# Patient Record
Sex: Female | Born: 1990 | Race: White | Hispanic: No | Smoking: Former smoker
Health system: Southern US, Community
[De-identification: ages and names within clinical notes are randomized; demographics above are authoritative.]

## PROBLEM LIST (undated history)

## (undated) ENCOUNTER — Inpatient Hospital Stay (HOSPITAL_COMMUNITY): Payer: Self-pay

## (undated) DIAGNOSIS — K219 Gastro-esophageal reflux disease without esophagitis: Secondary | ICD-10-CM

## (undated) DIAGNOSIS — J45909 Unspecified asthma, uncomplicated: Secondary | ICD-10-CM

## (undated) DIAGNOSIS — Z8719 Personal history of other diseases of the digestive system: Secondary | ICD-10-CM

## (undated) DIAGNOSIS — K5120 Ulcerative (chronic) proctitis without complications: Secondary | ICD-10-CM

## (undated) DIAGNOSIS — F329 Major depressive disorder, single episode, unspecified: Secondary | ICD-10-CM

## (undated) DIAGNOSIS — F909 Attention-deficit hyperactivity disorder, unspecified type: Secondary | ICD-10-CM

## (undated) DIAGNOSIS — F419 Anxiety disorder, unspecified: Secondary | ICD-10-CM

## (undated) DIAGNOSIS — F32A Depression, unspecified: Secondary | ICD-10-CM

## (undated) HISTORY — PX: TONSILLECTOMY: SUR1361

---
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
## 2015-01-04 DIAGNOSIS — F411 Generalized anxiety disorder: Secondary | ICD-10-CM | POA: Insufficient documentation

## 2015-01-04 DIAGNOSIS — E669 Obesity, unspecified: Secondary | ICD-10-CM | POA: Insufficient documentation

## 2015-10-13 ENCOUNTER — Encounter (HOSPITAL_COMMUNITY): Payer: Self-pay

## 2015-10-13 ENCOUNTER — Emergency Department (HOSPITAL_COMMUNITY)
Admission: EM | Admit: 2015-10-13 | Discharge: 2015-10-13 | Disposition: A | Discharge status: Home / Self Care | Payer: BLUE CROSS/BLUE SHIELD | Attending: Emergency Medicine | Admitting: Emergency Medicine

## 2015-10-13 DIAGNOSIS — R197 Diarrhea, unspecified: Secondary | ICD-10-CM | POA: Diagnosis not present

## 2015-10-13 DIAGNOSIS — K921 Melena: Secondary | ICD-10-CM | POA: Insufficient documentation

## 2015-10-13 DIAGNOSIS — F909 Attention-deficit hyperactivity disorder, unspecified type: Secondary | ICD-10-CM | POA: Insufficient documentation

## 2015-10-13 DIAGNOSIS — J45909 Unspecified asthma, uncomplicated: Secondary | ICD-10-CM | POA: Insufficient documentation

## 2015-10-13 DIAGNOSIS — Z79899 Other long term (current) drug therapy: Secondary | ICD-10-CM | POA: Diagnosis not present

## 2015-10-13 DIAGNOSIS — R1084 Generalized abdominal pain: Secondary | ICD-10-CM | POA: Diagnosis not present

## 2015-10-13 DIAGNOSIS — F419 Anxiety disorder, unspecified: Secondary | ICD-10-CM | POA: Insufficient documentation

## 2015-10-13 DIAGNOSIS — F17210 Nicotine dependence, cigarettes, uncomplicated: Secondary | ICD-10-CM | POA: Insufficient documentation

## 2015-10-13 DIAGNOSIS — Z7951 Long term (current) use of inhaled steroids: Secondary | ICD-10-CM | POA: Diagnosis not present

## 2015-10-13 DIAGNOSIS — K219 Gastro-esophageal reflux disease without esophagitis: Secondary | ICD-10-CM | POA: Diagnosis not present

## 2015-10-13 DIAGNOSIS — Z792 Long term (current) use of antibiotics: Secondary | ICD-10-CM | POA: Diagnosis not present

## 2015-10-13 DIAGNOSIS — Z3202 Encounter for pregnancy test, result negative: Secondary | ICD-10-CM | POA: Insufficient documentation

## 2015-10-13 HISTORY — DX: Unspecified asthma, uncomplicated: J45.909

## 2015-10-13 HISTORY — DX: Gastro-esophageal reflux disease without esophagitis: K21.9

## 2015-10-13 HISTORY — DX: Attention-deficit hyperactivity disorder, unspecified type: F90.9

## 2015-10-13 HISTORY — DX: Anxiety disorder, unspecified: F41.9

## 2015-10-13 LAB — GASTROINTESTINAL PANEL BY PCR, STOOL (REPLACES STOOL CULTURE)
Adenovirus F40/41: NOT DETECTED
Astrovirus: NOT DETECTED
CAMPYLOBACTER SPECIES: NOT DETECTED
Cryptosporidium: NOT DETECTED
Cyclospora cayetanensis: NOT DETECTED
E. COLI O157: NOT DETECTED
ENTEROAGGREGATIVE E COLI (EAEC): NOT DETECTED
ENTEROTOXIGENIC E COLI (ETEC): NOT DETECTED
Entamoeba histolytica: NOT DETECTED
Enteropathogenic E coli (EPEC): NOT DETECTED
GIARDIA LAMBLIA: NOT DETECTED
NOROVIRUS GI/GII: NOT DETECTED
PLESIMONAS SHIGELLOIDES: NOT DETECTED
Rotavirus A: NOT DETECTED
SALMONELLA SPECIES: NOT DETECTED
SHIGELLA/ENTEROINVASIVE E COLI (EIEC): NOT DETECTED
Sapovirus (I, II, IV, and V): NOT DETECTED
Shiga like toxin producing E coli (STEC): NOT DETECTED
Vibrio cholerae: NOT DETECTED
Vibrio species: NOT DETECTED
Yersinia enterocolitica: NOT DETECTED

## 2015-10-13 LAB — POC OCCULT BLOOD, ED: FECAL OCCULT BLD: POSITIVE — AB

## 2015-10-13 LAB — CBC WITH DIFFERENTIAL/PLATELET
BASOS PCT: 0 %
Basophils Absolute: 0 10*3/uL (ref 0.0–0.1)
EOS ABS: 0.3 10*3/uL (ref 0.0–0.7)
Eosinophils Relative: 3 %
HCT: 39.4 % (ref 36.0–46.0)
HEMOGLOBIN: 13.2 g/dL (ref 12.0–15.0)
Lymphocytes Relative: 25 %
Lymphs Abs: 2.6 10*3/uL (ref 0.7–4.0)
MCH: 26.6 pg (ref 26.0–34.0)
MCHC: 33.5 g/dL (ref 30.0–36.0)
MCV: 79.3 fL (ref 78.0–100.0)
Monocytes Absolute: 1.2 10*3/uL — ABNORMAL HIGH (ref 0.1–1.0)
Monocytes Relative: 12 %
NEUTROS PCT: 60 %
Neutro Abs: 6.4 10*3/uL (ref 1.7–7.7)
Platelets: 277 10*3/uL (ref 150–400)
RBC: 4.97 MIL/uL (ref 3.87–5.11)
RDW: 12.7 % (ref 11.5–15.5)
WBC: 10.6 10*3/uL — AB (ref 4.0–10.5)

## 2015-10-13 LAB — COMPREHENSIVE METABOLIC PANEL
ALBUMIN: 4.1 g/dL (ref 3.5–5.0)
ALT: 11 U/L — ABNORMAL LOW (ref 14–54)
AST: 15 U/L (ref 15–41)
Alkaline Phosphatase: 59 U/L (ref 38–126)
Anion gap: 12 (ref 5–15)
BILIRUBIN TOTAL: 0.2 mg/dL — AB (ref 0.3–1.2)
BUN: 12 mg/dL (ref 6–20)
CHLORIDE: 104 mmol/L (ref 101–111)
CO2: 22 mmol/L (ref 22–32)
Calcium: 9.4 mg/dL (ref 8.9–10.3)
Creatinine, Ser: 0.71 mg/dL (ref 0.44–1.00)
GFR calc Af Amer: >60 mL/min (ref >60)
GFR calc non Af Amer: >60 mL/min (ref >60)
GLUCOSE: 97 mg/dL (ref 65–99)
POTASSIUM: 4.2 mmol/L (ref 3.5–5.1)
Sodium: 138 mmol/L (ref 135–145)
Total Protein: 7.5 g/dL (ref 6.5–8.1)

## 2015-10-13 LAB — I-STAT BETA HCG BLOOD, ED (MC, WL, AP ONLY)

## 2015-10-13 LAB — TYPE AND SCREEN
ABO/RH(D): A NEG
Antibody Screen: NEGATIVE

## 2015-10-13 LAB — LIPASE, BLOOD: LIPASE: 22 U/L (ref 11–51)

## 2015-10-13 LAB — ABO/RH: ABO/RH(D): A NEG

## 2015-10-13 MED ORDER — SODIUM CHLORIDE 0.9 % IV BOLUS (SEPSIS)
1000.0000 mL | Freq: Once | INTRAVENOUS | Status: AC
  Administered 2015-10-13: 1000 mL via INTRAVENOUS

## 2015-10-13 MED ORDER — DICYCLOMINE HCL 20 MG PO TABS: 20.0000 mg | ORAL_TABLET | Freq: Two times a day (BID) | ORAL | Status: DC

## 2015-10-13 MED ORDER — LOPERAMIDE HCL 2 MG PO CAPS: 2.0000 mg | ORAL_CAPSULE | Freq: Four times a day (QID) | ORAL | Status: DC | PRN

## 2015-10-13 MED ORDER — DICYCLOMINE HCL 10 MG PO CAPS
10.0000 mg | ORAL_CAPSULE | Freq: Once | ORAL | Status: AC
  Administered 2015-10-13: 10 mg via ORAL
  Filled 2015-10-13: qty 1

## 2015-10-13 NOTE — ED Provider Notes (Signed)
CSN: [id number]     Arrival date & time 10/13/15  4098 History   First MD Initiated Contact with Patient 10/13/15 1000     Chief Complaint  Patient presents with  . Abdominal Pain  . Rectal Bleeding     (Consider location/radiation/quality/duration/timing/severity/associated sxs/prior Treatment) HPI   Patient is a 25 year old female with past medical history of GERD, asthma and anxiety who presents to the ED with complaint of diarrhea. Patient reports she has been having diarrhea since 09/21/15. She reports initially having approximately 10 episodes of "rust colored" diarrhea daily but notes over the past week she has been having approximately 20 episodes. She also notes that her stool is a dark maroon/rust color which she notes has also been worsening over the past week. Patient also reports having constant dull/cramping pain to her right abdomen with intermittent episodes of sharp shooting pain, denies any aggravating or alleviating factors. Endorses associated chills and intermittent nausea. Denies fever, nasal congestion, sore throat, cough, shortness of breath, chest pain, vomiting, urinary symptoms, vaginal bleeding, vaginal discharge, rash. She notes she initially was taking Imodium and Pepto-Bismol at home without relief. She notes she was seen at [facility] health care (Dr. Jilda Roche) on 10/10/15, blood work and stool samples were obtained. Patient states she was started on Cipro and notes she has been taking it twice daily as prescribed and was advised to stop taking Imodium and Pepto-Bismol. Patient denies any recent travel, camping or drinking from fresh water. She reports living in an apartment complex with city water, denies her roommates having similar sxs. Denies any recent sick contacts or anyone at home having similar symptoms. LMP 2/19. Denies hx of abdominal surgeries.    Past Medical History  Diagnosis Date  . GERD (gastroesophageal reflux disease)   . ADHD (attention deficit  hyperactivity disorder)   . Asthma   . Anxiety    Past Surgical History  Procedure Laterality Date  . Tonsillectomy     History reviewed. No pertinent family history. Social History  Substance Use Topics  . Smoking status: Current Some Day Smoker    Types: Cigarettes  . Smokeless tobacco: Never Used  . Alcohol Use: Yes     Comment: ocasionally   OB History    No data available     Review of Systems  Gastrointestinal: Positive for abdominal pain, diarrhea and blood in stool.  All other systems reviewed and are negative.     Allergies  Azithromycin; Bupropion; and Cefprozil  Home Medications   Prior to Admission medications   Medication Sig Start Date End Date Taking? Authorizing Provider  amphetamine-dextroamphetamine (ADDERALL XR) 30 MG 24 hr capsule Take 60 mg by mouth daily as needed (concentration working).  10/03/15  Yes Historical Provider, MD  ciprofloxacin (CIPRO) 500 MG tablet Take 500 mg by mouth 2 (two) times daily.   Yes Historical Provider, MD  clonazePAM (KLONOPIN) 0.5 MG tablet Take 0.5 mg by mouth daily as needed. anxiety 07/10/15  Yes Historical Provider, MD  fluticasone (FLONASE) 50 MCG/ACT nasal spray Place 1 spray into both nostrils 2 (two) times daily as needed. allergies 09/13/15  Yes Historical Provider, MD  ipratropium (ATROVENT) 0.03 % nasal spray Place 2 sprays into both nostrils daily as needed. allergies 06/15/15 06/14/16 Yes Historical Provider, MD  omeprazole (PRILOSEC) 20 MG capsule Take 20 mg by mouth daily. 09/13/15  Yes Historical Provider, MD  triamcinolone ointment (KENALOG) 0.1 % Apply 1 application topically daily as needed. Irritation/rash 06/15/15 06/14/16 Yes Historical Provider,  MD  VENTOLIN HFA 108 (90 Base) MCG/ACT inhaler Inhale 2 puffs into the lungs every 6 (six) hours as needed. Shortness of breath/ wheezing 09/01/15  Yes Historical Provider, MD  dicyclomine (BENTYL) 20 MG tablet Take 1 tablet (20 mg total) by mouth 2 (two) times  daily. 10/13/15   Barrett Henle, PA-C  fexofenadine (ALLEGRA) 180 MG tablet Take 180 mg by mouth daily as needed. Reported on 10/13/2015 09/14/15   Historical Provider, MD  loperamide (IMODIUM) 2 MG capsule Take 1 capsule (2 mg total) by mouth 4 (four) times daily as needed for diarrhea or loose stools. 10/13/15   Satira Sark Nadeau, PA-C   BP 109/72 mmHg  Pulse 87  Temp(Src) 98.2 F (36.8 C) (Oral)  Resp 16  Ht 5\' 3"  (1.6 m)  Wt 72.576 kg  BMI 28.35 kg/m2  SpO2 98%  LMP 10/13/2015 Physical Exam  Constitutional: She is oriented to person, place, and time. She appears well-developed and well-nourished. No distress.  HENT:  Head: Normocephalic and atraumatic.  Mouth/Throat: Oropharynx is clear and moist. No oropharyngeal exudate.  Eyes: Conjunctivae and EOM are normal. Right eye exhibits no discharge. Left eye exhibits no discharge. No scleral icterus.  Neck: Normal range of motion. Neck supple.  Cardiovascular: Normal rate, regular rhythm, normal heart sounds and intact distal pulses.   Pulmonary/Chest: Effort normal and breath sounds normal. No respiratory distress. She has no wheezes. She has no rales. She exhibits no tenderness.  Abdominal: Soft. Bowel sounds are normal. She exhibits no distension and no mass. There is tenderness (difffuse abdominal tenderness, worse on right). There is no rebound and no guarding.  Genitourinary: Rectal exam shows no external hemorrhoid, no internal hemorrhoid, no fissure, no mass, no tenderness and anal tone normal. Guaiac positive stool.  Musculoskeletal: Normal range of motion. She exhibits no edema.  Lymphadenopathy:    She has no cervical adenopathy.  Neurological: She is alert and oriented to person, place, and time.  Skin: Skin is warm and dry. She is not diaphoretic.  Nursing note and vitals reviewed.   ED Course  Procedures (including critical care time) Labs Review Labs Reviewed  COMPREHENSIVE METABOLIC PANEL - Abnormal;  Notable for the following:    ALT 11 (*)    Total Bilirubin 0.2 (*)    All other components within normal limits  CBC WITH DIFFERENTIAL/PLATELET - Abnormal; Notable for the following:    WBC 10.6 (*)    Monocytes Absolute 1.2 (*)    All other components within normal limits  POC OCCULT BLOOD, ED - Abnormal; Notable for the following:    Fecal Occult Bld POSITIVE (*)    All other components within normal limits  GASTROINTESTINAL PANEL BY PCR, STOOL (REPLACES STOOL CULTURE)  LIPASE, BLOOD  I-STAT BETA HCG BLOOD, ED ([facility], [facility], [facility] ONLY)  TYPE AND SCREEN  ABO/RH    Imaging Review No results found. I have personally reviewed and evaluated these images and lab results as part of my medical decision-making.   EKG Interpretation None      MDM   Final diagnoses:  Diarrhea, unspecified type    Pt presents with diarrhea and associated abdominal pain and maroon colored stool. She has been taking Cipro for 3 days without relief. VSS. Exam revealed diffuse mild abdominal tenderness, no peritoneal signs, rectal exam unremarkable, no gross blood noted. Pt given IVF and bentyl in the ED. Hemoccult positive. Remaining labs unremarkable. GI panel PCR stool pending. On reevaluation, pt reports she is feeling  a little better. Discussed case with Dr. Effie Shy. It does not appear that pt's diarrhea is due to infectious etiology. Negative risk factors for parasitic infection. Sxs may be due to IBS/IBD. Plan to d/c pt home with symptomatic tx, advise pt to stop taking cipro and to follow up with GI. Discussed results and plan for d/c with pt.     Satira Sark [place], [place] 10/14/15 1000  Mancel Bale, MD 10/14/15 [phone]

## 2015-10-13 NOTE — ED Notes (Signed)
Patient c/o generalized abdominal pain and a combination of bright red and rust colored blood in her stool. Patient went to her PCP 2 days ago where she gave stool specimens. Patient states she is continuing to have symptoms.

## 2015-10-13 NOTE — Discharge Instructions (Signed)
Take your medication as prescribed. Continue drinking fluids to remain hydrated at home. Call the gastroenterology office listed above to schedule a follow-up appointment. Please return to the Emergency Department if symptoms worsen or new onset of fever, abdominal pain, vomiting, bright red bloody stool, vaginal bleeding, lightheadedness, dizziness, syncope.

## 2015-10-20 DIAGNOSIS — J4540 Moderate persistent asthma, uncomplicated: Secondary | ICD-10-CM | POA: Insufficient documentation

## 2016-05-21 LAB — OB RESULTS CONSOLE HEPATITIS B SURFACE ANTIGEN: HEP B S AG: NEGATIVE

## 2016-05-21 LAB — OB RESULTS CONSOLE ABO/RH: RH TYPE: POSITIVE

## 2016-05-21 LAB — OB RESULTS CONSOLE GC/CHLAMYDIA
Chlamydia: NEGATIVE
GC PROBE AMP, GENITAL: NEGATIVE

## 2016-05-21 LAB — OB RESULTS CONSOLE RUBELLA ANTIBODY, IGM: RUBELLA: IMMUNE

## 2016-05-21 LAB — OB RESULTS CONSOLE ANTIBODY SCREEN: ANTIBODY SCREEN: NEGATIVE

## 2016-05-21 LAB — OB RESULTS CONSOLE HIV ANTIBODY (ROUTINE TESTING): HIV: NONREACTIVE

## 2016-05-21 LAB — OB RESULTS CONSOLE RPR: RPR: NONREACTIVE

## 2016-09-14 ENCOUNTER — Inpatient Hospital Stay (HOSPITAL_COMMUNITY)
Admission: AD | Admit: 2016-09-14 | Discharge: 2016-09-15 | Disposition: A | Discharge status: Home / Self Care | Payer: BLUE CROSS/BLUE SHIELD | Source: Ambulatory Visit | Attending: Obstetrics and Gynecology | Admitting: Obstetrics and Gynecology

## 2016-09-14 ENCOUNTER — Encounter (HOSPITAL_COMMUNITY): Payer: Self-pay | Admitting: *Deleted

## 2016-09-14 DIAGNOSIS — O99342 Other mental disorders complicating pregnancy, second trimester: Secondary | ICD-10-CM | POA: Diagnosis not present

## 2016-09-14 DIAGNOSIS — O99612 Diseases of the digestive system complicating pregnancy, second trimester: Secondary | ICD-10-CM | POA: Diagnosis not present

## 2016-09-14 DIAGNOSIS — M545 Low back pain: Secondary | ICD-10-CM | POA: Insufficient documentation

## 2016-09-14 DIAGNOSIS — O479 False labor, unspecified: Secondary | ICD-10-CM

## 2016-09-14 DIAGNOSIS — R109 Unspecified abdominal pain: Secondary | ICD-10-CM | POA: Diagnosis not present

## 2016-09-14 DIAGNOSIS — Z3A27 27 weeks gestation of pregnancy: Secondary | ICD-10-CM | POA: Diagnosis not present

## 2016-09-14 DIAGNOSIS — Z79899 Other long term (current) drug therapy: Secondary | ICD-10-CM | POA: Diagnosis not present

## 2016-09-14 DIAGNOSIS — Z87891 Personal history of nicotine dependence: Secondary | ICD-10-CM | POA: Insufficient documentation

## 2016-09-14 DIAGNOSIS — Z888 Allergy status to other drugs, medicaments and biological substances status: Secondary | ICD-10-CM | POA: Diagnosis not present

## 2016-09-14 DIAGNOSIS — O99512 Diseases of the respiratory system complicating pregnancy, second trimester: Secondary | ICD-10-CM | POA: Insufficient documentation

## 2016-09-14 DIAGNOSIS — O4702 False labor before 37 completed weeks of gestation, second trimester: Secondary | ICD-10-CM | POA: Insufficient documentation

## 2016-09-14 DIAGNOSIS — Z9889 Other specified postprocedural states: Secondary | ICD-10-CM | POA: Diagnosis not present

## 2016-09-14 DIAGNOSIS — O26892 Other specified pregnancy related conditions, second trimester: Secondary | ICD-10-CM | POA: Insufficient documentation

## 2016-09-14 DIAGNOSIS — O368120 Decreased fetal movements, second trimester, not applicable or unspecified: Secondary | ICD-10-CM | POA: Insufficient documentation

## 2016-09-14 DIAGNOSIS — F909 Attention-deficit hyperactivity disorder, unspecified type: Secondary | ICD-10-CM | POA: Insufficient documentation

## 2016-09-14 LAB — URINALYSIS, ROUTINE W REFLEX MICROSCOPIC
Bilirubin Urine: NEGATIVE
GLUCOSE, UA: NEGATIVE mg/dL
Hgb urine dipstick: NEGATIVE
KETONES UR: NEGATIVE mg/dL
LEUKOCYTES UA: NEGATIVE
Nitrite: NEGATIVE
PH: 6 (ref 5.0–8.0)
Protein, ur: NEGATIVE mg/dL
SPECIFIC GRAVITY, URINE: 1.008 (ref 1.005–1.030)

## 2016-09-14 NOTE — MAU Provider Note (Signed)
History     CSN: [id number]  Arrival date and time: 09/14/16 2245   First Provider Initiated Contact with Patient 09/14/16 2333      Chief Complaint  Patient presents with  . Decreased Fetal Movement  . Abdominal Pain  . Back Pain   HPI  Joanna Galvan is a 26 y.o. G1P0 at 5421w2d who presents with abdominal pain & decreased fetal movement. 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 Noticed intermittent low back pain that radiated to her low abdomen. Pain was coming & going; unsure how frequently. Pain resolved 20 minutes PTA at MAU. Also has noticed decreased fetal movement since this morning; movement has returned to normal since arriving to MAU.  Denies n/v/d, constipation, LOF, or vaginal bleeding. No recent intercourse. States she only drank 2 cups of water today. Denies complications with pregnancy.    OB History    Gravida Para Term Preterm AB Living   1             SAB TAB Ectopic Multiple Live Births                  Past Medical History:  Diagnosis Date  . ADHD (attention deficit hyperactivity disorder)   . Anxiety   . Asthma   . GERD (gastroesophageal reflux disease)     Past Surgical History:  Procedure Laterality Date  . TONSILLECTOMY      No family history on file.  Social History  Substance Use Topics  . Smoking status: Former Smoker    Types: Cigarettes    Quit date: 12/14/2015  . Smokeless tobacco: Never Used  . Alcohol use Yes     Comment: ocasionally    Allergies:  Allergies  Allergen Reactions  . Azithromycin Hives  . Bupropion Hives  . Cefprozil Hives    Prescriptions Prior to Admission  Medication Sig Dispense Refill Last Dose  . omeprazole (PRILOSEC) 20 MG capsule Take 20 mg by mouth daily.  3 Past Week at Unknown time  . VENTOLIN HFA 108 (90 Base) MCG/ACT inhaler Inhale 2 puffs into the lungs every 6 (six) hours as needed. Shortness of breath/ wheezing  2 Past Month at Unknown time  .  amphetamine-dextroamphetamine (ADDERALL XR) 30 MG 24 hr capsule Take 60 mg by mouth daily as needed (concentration working).   0 More than a month at Unknown time  . ciprofloxacin (CIPRO) 500 MG tablet Take 500 mg by mouth 2 (two) times daily.   10/12/2015 at Unknown time  . clonazePAM (KLONOPIN) 0.5 MG tablet Take 0.5 mg by mouth daily as needed. anxiety   More than a month at Unknown time  . dicyclomine (BENTYL) 20 MG tablet Take 1 tablet (20 mg total) by mouth 2 (two) times daily. 20 tablet 0 More than a month at Unknown time  . fexofenadine (ALLEGRA) 180 MG tablet Take 180 mg by mouth daily as needed. Reported on 10/13/2015   Not Taking at Unknown time  . fluticasone (FLONASE) 50 MCG/ACT nasal spray Place 1 spray into both nostrils 2 (two) times daily as needed. allergies  3 More than a month at Unknown time  . ipratropium (ATROVENT) 0.03 % nasal spray Place 2 sprays into both nostrils daily as needed. allergies   unknown  . loperamide (IMODIUM) 2 MG capsule Take 1 capsule (2 mg total) by mouth 4 (four) times daily as needed for diarrhea or loose stools. 12 capsule 0 More than a month  at Unknown time    Review of Systems  Constitutional: Negative.   Gastrointestinal: Positive for abdominal pain. Negative for constipation, diarrhea, nausea and vomiting.  Genitourinary: Negative for dysuria, vaginal bleeding and vaginal discharge.  Musculoskeletal: Positive for back pain.   Physical Exam   Blood pressure 112/74, pulse 112, temperature 97.7 F (36.5 C), resp. rate 18, height 5\' 3"  (1.6 m), weight 174 lb 12.8 oz (79.3 kg), last menstrual period 10/13/2015, SpO2 99 %.  Physical Exam  Nursing note and vitals reviewed. Constitutional: She is oriented to person, place, and time. She appears well-developed and well-nourished. No distress.  HENT:  Head: Normocephalic and atraumatic.  Eyes: Conjunctivae are normal. Right eye exhibits no discharge. Left eye exhibits no discharge. No scleral icterus.   Neck: Normal range of motion.  Cardiovascular: Normal rate, regular rhythm and normal heart sounds.   No murmur heard. Respiratory: Effort normal and breath sounds normal. No respiratory distress. She has no wheezes.  GI: Soft. There is no tenderness.  Neurological: She is alert and oriented to person, place, and time.  Skin: Skin is warm and dry. She is not diaphoretic.  Psychiatric: She has a normal mood and affect. Her behavior is normal. Judgment and thought content normal.   Dilation: Closed Effacement (%): Thick Cervical Position: Posterior Exam by:: E Christianna Belmonte   Fetal Tracing:  Baseline: 130 Variability: moderate Accelerations: 15x15 Decelerations: none  Toco: UI MAU Course  Procedures Results for orders placed or performed during the hospital encounter of 09/14/16 (from the past 24 hour(s))  Urinalysis, Routine w reflex microscopic     Status: Abnormal   Collection Time: 09/14/16 11:00 PM  Result Value Ref Range   Color, Urine STRAW (A) YELLOW   APPearance CLEAR CLEAR   Specific Gravity, Urine 1.008 1.005 - 1.030   pH 6.0 5.0 - 8.0   Glucose, UA NEGATIVE NEGATIVE mg/dL   Hgb urine dipstick NEGATIVE NEGATIVE   Bilirubin Urine NEGATIVE NEGATIVE   Ketones, ur NEGATIVE NEGATIVE mg/dL   Protein, ur NEGATIVE NEGATIVE mg/dL   Nitrite NEGATIVE NEGATIVE   Leukocytes, UA NEGATIVE NEGATIVE    MDM Category 1 tracing Uterine irritability that resolved after PO hydration SVE closed S/w Dr. Senaida Ores. Ok to discharge home.   Assessment and Plan  A: 1. Braxton Hicks contractions   2. Decreased fetal movements in second trimester, single or unspecified fetus    P: Discharge home Preterm labor precautions Increase water intake, esp. During work shifts Discussed reasons to return to MAU Keep f/u with OB  Joanna Galvan 09/14/2016, 11:33 PM

## 2016-09-14 NOTE — MAU Note (Addendum)
Pt reports decreased movement today. Only 2 kicks in one hour when counting @ home. Pt thinks that she was having some contractions earlier this evening, but now has not felt any pain/pressure in the last 30-60 minutes. Denies Leaking of fluid or bleeding.

## 2016-09-14 NOTE — MAU Note (Signed)
Having pain in lower back that comes around to abdomen. More on R side. Was very tight and lots of pressure. Felt tightness and pressure around abd that stayed for an hour or so and then release and then tighten again. Denies vag bleeding or LOF. Has not noticed FM today but was at work. After work ate and drank and only felt 2 movements in an hour.

## 2016-09-15 DIAGNOSIS — O479 False labor, unspecified: Secondary | ICD-10-CM | POA: Diagnosis not present

## 2016-09-15 DIAGNOSIS — O4702 False labor before 37 completed weeks of gestation, second trimester: Secondary | ICD-10-CM | POA: Diagnosis not present

## 2016-09-15 NOTE — Discharge Instructions (Signed)
. °Preterm Labor and Birth Information °The normal length of a pregnancy is 39-41 weeks. Preterm labor is when labor starts before 37 completed weeks of pregnancy. °What are the risk factors for preterm labor? °Preterm labor is more likely to occur in women who: °· Have certain infections during pregnancy such as a bladder infection, sexually transmitted infection, or infection inside the uterus (chorioamnionitis). °· Have a shorter-than-normal cervix. °· Have gone into preterm labor before. °· Have had surgery on their cervix. °· Are younger than age 17 or older than age 35. °· Are African American. °· Are pregnant with twins or multiple babies (multiple gestation). °· Take street drugs or smoke while pregnant. °· Do not gain enough weight while pregnant. °· Became pregnant shortly after having been pregnant. °What are the symptoms of preterm labor? °Symptoms of preterm labor include: °· Cramps similar to those that can happen during a menstrual period. The cramps may happen with diarrhea. °· Pain in the abdomen or lower back. °· Regular uterine contractions that may feel like tightening of the abdomen. °· A feeling of increased pressure in the pelvis. °· Increased watery or bloody mucus discharge from the vagina. °· Water breaking (ruptured amniotic sac). °Why is it important to recognize signs of preterm labor? °It is important to recognize signs of preterm labor because babies who are born prematurely may not be fully developed. This can put them at an increased risk for: °· Long-term (chronic) heart and lung problems. °· Difficulty immediately after birth with regulating body systems, including blood sugar, body temperature, heart rate, and breathing rate. °· Bleeding in the brain. °· Cerebral palsy. °· Learning difficulties. °· Death. °These risks are highest for babies who are born before 34 weeks of pregnancy. °How is preterm labor treated? °Treatment depends on the length of your pregnancy, your condition,  and the health of your baby. It may involve: °· Having a stitch (suture) placed in your cervix to prevent your cervix from opening too early (cerclage). °· Taking or being given medicines, such as: °¨ Hormone medicines. These may be given early in pregnancy to help support the pregnancy. °¨ Medicine to stop contractions. °¨ Medicines to help mature the baby’s lungs. These may be prescribed if the risk of delivery is high. °¨ Medicines to prevent your baby from developing cerebral palsy. °If the labor happens before 34 weeks of pregnancy, you may need to stay in the hospital. °What should I do if I think I am in preterm labor? °If you think that you are going into preterm labor, call your health care provider right away. °How can I prevent preterm labor in future pregnancies? °To increase your chance of having a full-term pregnancy: °· Do not use any tobacco products, such as cigarettes, chewing tobacco, and e-cigarettes. If you need help quitting, ask your health care provider. °· Do not use street drugs or medicines that have not been prescribed to you during your pregnancy. °· Talk with your health care provider before taking any herbal supplements, even if you have been taking them regularly. °· Make sure you gain a healthy amount of weight during your pregnancy. °· Watch for infection. If you think that you might have an infection, get it checked right away. °· Make sure to tell your health care provider if you have gone into preterm labor before. °This information is not intended to replace advice given to you by your health care provider. Make sure you discuss any questions you have with your   health care provider. °Document Released: 10/26/2003 Document Revised: 01/16/2016 Document Reviewed: 12/27/2015 °Elsevier Interactive Patient Education © 2017 [employer] ° °

## 2016-11-14 LAB — OB RESULTS CONSOLE GBS: STREP GROUP B AG: NEGATIVE

## 2016-11-18 ENCOUNTER — Encounter (HOSPITAL_COMMUNITY): Payer: Self-pay

## 2016-11-18 ENCOUNTER — Telehealth (HOSPITAL_COMMUNITY): Payer: Self-pay | Admitting: *Deleted

## 2016-11-18 NOTE — Telephone Encounter (Signed)
Preadmission screen  

## 2016-11-20 ENCOUNTER — Telehealth (HOSPITAL_COMMUNITY): Payer: Self-pay | Admitting: *Deleted

## 2016-11-20 ENCOUNTER — Encounter (HOSPITAL_COMMUNITY): Payer: Self-pay

## 2016-11-20 NOTE — Telephone Encounter (Signed)
Preadmission screen  

## 2016-11-25 ENCOUNTER — Encounter (HOSPITAL_COMMUNITY): Payer: Self-pay | Admitting: Anesthesiology

## 2016-11-25 ENCOUNTER — Encounter (HOSPITAL_COMMUNITY)
Admission: RE | Admit: 2016-11-25 | Discharge: 2016-11-25 | Disposition: A | Discharge status: Home / Self Care | Payer: BLUE CROSS/BLUE SHIELD | Source: Ambulatory Visit | Attending: Obstetrics and Gynecology | Admitting: Obstetrics and Gynecology

## 2016-11-25 DIAGNOSIS — Z01812 Encounter for preprocedural laboratory examination: Secondary | ICD-10-CM

## 2016-11-25 DIAGNOSIS — O321XX0 Maternal care for breech presentation, not applicable or unspecified: Secondary | ICD-10-CM | POA: Insufficient documentation

## 2016-11-25 DIAGNOSIS — Z3A37 37 weeks gestation of pregnancy: Secondary | ICD-10-CM

## 2016-11-25 HISTORY — DX: Personal history of other diseases of the digestive system: Z87.19

## 2016-11-25 LAB — CBC
HCT: 32.4 % — ABNORMAL LOW (ref 36.0–46.0)
Hemoglobin: 10.6 g/dL — ABNORMAL LOW (ref 12.0–15.0)
MCH: 24.7 pg — ABNORMAL LOW (ref 26.0–34.0)
MCHC: 32.7 g/dL (ref 30.0–36.0)
MCV: 75.5 fL — ABNORMAL LOW (ref 78.0–100.0)
PLATELETS: 241 10*3/uL (ref 150–400)
RBC: 4.29 MIL/uL (ref 3.87–5.11)
RDW: 14.6 % (ref 11.5–15.5)
WBC: 13.3 10*3/uL — AB (ref 4.0–10.5)

## 2016-11-25 NOTE — Patient Instructions (Signed)
[name]  11/25/2016   Your procedure is scheduled on:  11/26/2016  Enter through the Main Entrance of [facility] at 0530 AM.  Pick up the phone at the desk and dial [phone].   Call this number if you have problems the morning of surgery: [phone]   Remember:   Do not eat food:After Midnight.  Do not drink clear liquids: After Midnight.  Take these medicines the morning of surgery with A SIP OF WATER: none   Do not wear jewelry, make-up or nail polish.  Do not wear lotions, powders, or perfumes. Do not wear deodorant.  Do not shave 48 hours prior to surgery.  Do not bring valuables to the hospital.  [facility] is not   responsible for any belongings or valuables brought to the hospital.  Contacts, dentures or bridgework may not be worn into surgery.  Leave suitcase in the car. After surgery it may be brought to your room.  For patients admitted to the hospital, checkout time is 11:00 AM the day of              discharge.   Patients discharged the day of surgery will not be allowed to drive             home.  Name and phone number of your driver: na  Special Instructions:   N/A   Please read over the following fact sheets that you were given:   Surgical Site Infection Prevention

## 2016-11-25 NOTE — H&P (Signed)
Joanna Galvan is a 26 y.o. female G1P0 at 51 4/7 weeks (EDD 12/13/16 by 7 week [place]) presenting for scheduled primary c-section for breech presentation and possible IUGR with [place] 11/12/16 showing EFW 10%ile placenta thickened breech dopplers and AFI WNL.  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   Patient declined genetic screening.  NST's have been reassuring.   Pt has a h/o asthma, stable on prn inhaler and a h/o anxiety that is stable on prn klonipin very sparingly used.  She has ulcerative colitis, stable off meds.   OB History    Gravida Para Term Preterm AB Living   1             SAB TAB Ectopic Multiple Live Births                 Past Medical History:  Diagnosis Date  . ADHD (attention deficit hyperactivity disorder)   . Anxiety   . Asthma   . GERD (gastroesophageal reflux disease)   . History of ulcerative colitis    Past Surgical History:  Procedure Laterality Date  . TONSILLECTOMY     Family History: family history includes Cancer in her maternal grandmother; Diabetes in her maternal grandmother and mother. Social History:  reports that she quit smoking about a year ago. Her smoking use included Cigarettes. She has never used smokeless tobacco. She reports that she drinks alcohol. She reports that she uses drugs, including Marijuana.     Maternal Diabetes: No Genetic Screening: Declined Maternal Ultrasounds/Referrals: Abnormal:  Findings:   IUGR, Other: 2vessel cord and dolichocephaly Fetal Ultrasounds or other Referrals:  None Maternal Substance Abuse:  No Significant Maternal Medications:  None Significant Maternal Lab Results:  None Other Comments:  None  Review of Systems  Gastrointestinal: Negative for abdominal pain.   Maternal Medical History:  Contractions: Frequency: irregular.   Perceived severity is mild.    Fetal activity: Perceived fetal activity is normal.    Prenatal complications: IUGR.   Prenatal Complications -  Diabetes: none.      Last menstrual period 10/13/2015. Maternal Exam:  Uterine Assessment: Contraction strength is mild.  Contraction frequency is irregular.   Abdomen: Patient reports no abdominal tenderness. Fetal presentation: breech  Introitus: Normal vulva. Normal vagina.    Physical Exam  Constitutional: She is oriented to person, place, and time. She appears well-developed and well-nourished.  Cardiovascular: Normal rate and regular rhythm.   Respiratory: Effort normal.  GI: Soft.  Genitourinary: Vagina normal.  Neurological: She is alert and oriented to person, place, and time.  Psychiatric: She has a normal mood and affect.    Prenatal labs: ABO, Rh: --/--/A NEG (04/09 0945) Antibody: NEG (04/09 0945) Rubella: Immune (10/03 0000) RPR: Nonreactive (10/03 0000)  HBsAg: Negative (10/03 0000)  HIV: Non-reactive (10/03 0000)  GBS:   Negative Declined genetic testing Hgb AA  One hour GCT 108  Assessment/Plan:  d/w pt c-section risks and benefits in detail and reviewed procedure.  Risks of bleeding infection and possible damage to bowel and bladder reviewed with her in detail.  Still breech.  Neonatology contacted and aware of 2vc, IUGR and dolichocephaly with no prior genetic screening as declined by patient. Oliver Pila 11/25/2016, 2:37 PM

## 2016-11-25 NOTE — Anesthesia Preprocedure Evaluation (Addendum)
Anesthesia Evaluation  Patient identified by MRN, date of birth, ID band Patient awake    Reviewed: Allergy & Precautions, NPO status , Patient's Chart, lab work & pertinent test results  Airway Mallampati: III       Dental no notable dental hx. (+) Teeth Intact   Pulmonary asthma , former smoker,    Pulmonary exam normal breath sounds clear to auscultation       Cardiovascular negative cardio ROS Normal cardiovascular exam Rhythm:Regular Rate:Normal     Neuro/Psych PSYCHIATRIC DISORDERS Anxiety ADHD    GI/Hepatic GERD  Medicated and Controlled,Hx/o ulcerative colitis    Endo/Other  Obesity   Renal/GU   negative genitourinary   Musculoskeletal negative musculoskeletal ROS (+)   Abdominal (+) + obese,   Peds  Hematology  (+) anemia ,   Anesthesia Other Findings   Reproductive/Obstetrics (+) Pregnancy 37 + weeks AOG IUGR 2 vessel cord Breech presentation                           Anesthesia Physical Anesthesia Plan  ASA: II  Anesthesia Plan: Epidural   Post-op Pain Management:    Induction: Intravenous  Airway Management Planned: Natural Airway  Additional Equipment:   Intra-op Plan:   Post-operative Plan: Extubation in OR  Informed Consent: I have reviewed the patients History and Physical, chart, labs and discussed the procedure including the risks, benefits and alternatives for the proposed anesthesia with the patient or authorized representative who has indicated his/her understanding and acceptance.   Dental advisory given  Plan Discussed with: Anesthesiologist, Surgeon and CRNA  Anesthesia Plan Comments:        Anesthesia Quick Evaluation

## 2016-11-26 ENCOUNTER — Inpatient Hospital Stay (HOSPITAL_COMMUNITY): Payer: BLUE CROSS/BLUE SHIELD | Admitting: Anesthesiology

## 2016-11-26 ENCOUNTER — Inpatient Hospital Stay (HOSPITAL_COMMUNITY)
Admission: RE | Admit: 2016-11-26 | Discharge: 2016-11-30 | DRG: 765 | Disposition: A | Discharge status: Home / Self Care | Payer: BLUE CROSS/BLUE SHIELD | Source: Ambulatory Visit | Attending: Obstetrics and Gynecology | Admitting: Obstetrics and Gynecology

## 2016-11-26 ENCOUNTER — Encounter (HOSPITAL_COMMUNITY): Payer: Self-pay

## 2016-11-26 ENCOUNTER — Encounter (HOSPITAL_COMMUNITY)
Admission: RE | Disposition: A | Discharge status: Home / Self Care | Payer: Self-pay | Source: Ambulatory Visit | Attending: Obstetrics and Gynecology

## 2016-11-26 DIAGNOSIS — Z6836 Body mass index (BMI) 36.0-36.9, adult: Secondary | ICD-10-CM

## 2016-11-26 DIAGNOSIS — O321XX0 Maternal care for breech presentation, not applicable or unspecified: Secondary | ICD-10-CM | POA: Diagnosis present

## 2016-11-26 DIAGNOSIS — O3403 Maternal care for unspecified congenital malformation of uterus, third trimester: Secondary | ICD-10-CM | POA: Diagnosis present

## 2016-11-26 DIAGNOSIS — J45909 Unspecified asthma, uncomplicated: Secondary | ICD-10-CM | POA: Diagnosis present

## 2016-11-26 DIAGNOSIS — K5190 Ulcerative colitis, unspecified, without complications: Secondary | ICD-10-CM | POA: Diagnosis present

## 2016-11-26 DIAGNOSIS — O9962 Diseases of the digestive system complicating childbirth: Secondary | ICD-10-CM | POA: Diagnosis present

## 2016-11-26 DIAGNOSIS — O365930 Maternal care for other known or suspected poor fetal growth, third trimester, not applicable or unspecified: Secondary | ICD-10-CM | POA: Diagnosis present

## 2016-11-26 DIAGNOSIS — O358XX0 Maternal care for other (suspected) fetal abnormality and damage, not applicable or unspecified: Secondary | ICD-10-CM | POA: Diagnosis present

## 2016-11-26 DIAGNOSIS — O9952 Diseases of the respiratory system complicating childbirth: Secondary | ICD-10-CM | POA: Diagnosis present

## 2016-11-26 DIAGNOSIS — K219 Gastro-esophageal reflux disease without esophagitis: Secondary | ICD-10-CM | POA: Diagnosis present

## 2016-11-26 DIAGNOSIS — Z3A37 37 weeks gestation of pregnancy: Secondary | ICD-10-CM | POA: Diagnosis not present

## 2016-11-26 DIAGNOSIS — O09899 Supervision of other high risk pregnancies, unspecified trimester: Secondary | ICD-10-CM

## 2016-11-26 DIAGNOSIS — Z87891 Personal history of nicotine dependence: Secondary | ICD-10-CM | POA: Diagnosis not present

## 2016-11-26 DIAGNOSIS — E669 Obesity, unspecified: Secondary | ICD-10-CM | POA: Diagnosis present

## 2016-11-26 DIAGNOSIS — D649 Anemia, unspecified: Secondary | ICD-10-CM | POA: Diagnosis present

## 2016-11-26 DIAGNOSIS — O99344 Other mental disorders complicating childbirth: Secondary | ICD-10-CM | POA: Diagnosis present

## 2016-11-26 DIAGNOSIS — Q514 Unicornate uterus: Secondary | ICD-10-CM

## 2016-11-26 DIAGNOSIS — R339 Retention of urine, unspecified: Secondary | ICD-10-CM | POA: Diagnosis not present

## 2016-11-26 DIAGNOSIS — Z833 Family history of diabetes mellitus: Secondary | ICD-10-CM | POA: Diagnosis not present

## 2016-11-26 DIAGNOSIS — O9902 Anemia complicating childbirth: Secondary | ICD-10-CM | POA: Diagnosis present

## 2016-11-26 DIAGNOSIS — O99214 Obesity complicating childbirth: Secondary | ICD-10-CM | POA: Diagnosis present

## 2016-11-26 DIAGNOSIS — O365931 Maternal care for other known or suspected poor fetal growth, third trimester, fetus 1: Secondary | ICD-10-CM

## 2016-11-26 DIAGNOSIS — F419 Anxiety disorder, unspecified: Secondary | ICD-10-CM | POA: Diagnosis present

## 2016-11-26 DIAGNOSIS — Z98891 History of uterine scar from previous surgery: Secondary | ICD-10-CM

## 2016-11-26 LAB — RPR: RPR: NONREACTIVE

## 2016-11-26 SURGERY — Surgical Case
Anesthesia: Epidural

## 2016-11-26 MED ORDER — NALBUPHINE HCL 10 MG/ML IJ SOLN: 5.0000 mg | INTRAMUSCULAR | Status: DC | PRN

## 2016-11-26 MED ORDER — PHENYLEPHRINE 8 MG IN D5W 100 ML (0.08MG/ML) PREMIX OPTIME
INJECTION | INTRAVENOUS | Status: AC
Start: 2016-11-26 — End: 2016-11-26
  Filled 2016-11-26: qty 100

## 2016-11-26 MED ORDER — OXYCODONE HCL 5 MG PO TABS
5.0000 mg | ORAL_TABLET | ORAL | Status: DC | PRN
  Administered 2016-11-26 – 2016-11-28 (×3): 5 mg via ORAL
  Filled 2016-11-26 (×3): qty 1

## 2016-11-26 MED ORDER — LACTATED RINGERS IV SOLN
INTRAVENOUS | Status: DC
  Administered 2016-11-26 (×2): via INTRAVENOUS

## 2016-11-26 MED ORDER — FENTANYL CITRATE (PF) 100 MCG/2ML IJ SOLN
INTRAMUSCULAR | Status: AC
  Filled 2016-11-26: qty 2

## 2016-11-26 MED ORDER — MORPHINE SULFATE-NACL 0.5-0.9 MG/ML-% IV SOSY
PREFILLED_SYRINGE | INTRAVENOUS | Status: DC | PRN
  Administered 2016-11-26 (×2): 1.5 mg via EPIDURAL
  Administered 2016-11-26: 2 mg via INTRAVENOUS

## 2016-11-26 MED ORDER — WITCH HAZEL-GLYCERIN EX PADS: 1.0000 "application " | MEDICATED_PAD | CUTANEOUS | Status: DC | PRN

## 2016-11-26 MED ORDER — SODIUM CHLORIDE 0.9% FLUSH: 3.0000 mL | INTRAVENOUS | Status: DC | PRN

## 2016-11-26 MED ORDER — ALBUTEROL SULFATE (2.5 MG/3ML) 0.083% IN NEBU: 2.5000 mg | INHALATION_SOLUTION | Freq: Four times a day (QID) | RESPIRATORY_TRACT | Status: DC | PRN

## 2016-11-26 MED ORDER — MEPERIDINE HCL 25 MG/ML IJ SOLN: 6.2500 mg | INTRAMUSCULAR | Status: DC | PRN

## 2016-11-26 MED ORDER — NALBUPHINE HCL 10 MG/ML IJ SOLN
5.0000 mg | INTRAMUSCULAR | Status: DC | PRN
Start: 2016-11-26 — End: 2016-11-30

## 2016-11-26 MED ORDER — GENTAMICIN SULFATE 40 MG/ML IJ SOLN
INTRAMUSCULAR | Status: AC
  Administered 2016-11-26: 100 mL via INTRAVENOUS
  Filled 2016-11-26: qty 8.25

## 2016-11-26 MED ORDER — DIBUCAINE 1 % RE OINT: 1.0000 "application " | TOPICAL_OINTMENT | RECTAL | Status: DC | PRN

## 2016-11-26 MED ORDER — LIDOCAINE-EPINEPHRINE (PF) 2 %-1:200000 IJ SOLN
INTRAMUSCULAR | Status: AC
  Filled 2016-11-26: qty 20

## 2016-11-26 MED ORDER — CLINDAMYCIN PHOSPHATE 900 MG/50ML IV SOLN: 900.0000 mg | INTRAVENOUS | Status: DC

## 2016-11-26 MED ORDER — NALBUPHINE HCL 10 MG/ML IJ SOLN: 5.0000 mg | Freq: Once | INTRAMUSCULAR | Status: DC | PRN

## 2016-11-26 MED ORDER — PRENATAL MULTIVITAMIN CH
1.0000 | ORAL_TABLET | Freq: Every day | ORAL | Status: DC
  Administered 2016-11-27 – 2016-11-30 (×4): 1 via ORAL
  Filled 2016-11-26 (×4): qty 1

## 2016-11-26 MED ORDER — DIPHENHYDRAMINE HCL 25 MG PO CAPS: 25.0000 mg | ORAL_CAPSULE | ORAL | Status: DC | PRN

## 2016-11-26 MED ORDER — ACETAMINOPHEN 500 MG PO TABS
1000.0000 mg | ORAL_TABLET | Freq: Four times a day (QID) | ORAL | Status: AC
  Administered 2016-11-26: 1000 mg via ORAL
  Filled 2016-11-26: qty 2

## 2016-11-26 MED ORDER — LACTATED RINGERS IV SOLN
INTRAVENOUS | Status: DC
  Administered 2016-11-26: 15:00:00 via INTRAVENOUS

## 2016-11-26 MED ORDER — KETOROLAC TROMETHAMINE 30 MG/ML IJ SOLN
30.0000 mg | Freq: Four times a day (QID) | INTRAMUSCULAR | Status: AC | PRN
  Administered 2016-11-27 (×2): 30 mg via INTRAVENOUS
  Filled 2016-11-26 (×2): qty 1

## 2016-11-26 MED ORDER — SENNOSIDES-DOCUSATE SODIUM 8.6-50 MG PO TABS
2.0000 | ORAL_TABLET | ORAL | Status: DC
  Administered 2016-11-27 – 2016-11-29 (×4): 2 via ORAL
  Filled 2016-11-26 (×4): qty 2

## 2016-11-26 MED ORDER — LIDOCAINE-EPINEPHRINE (PF) 2 %-1:200000 IJ SOLN
INTRAMUSCULAR | Status: DC | PRN
  Administered 2016-11-26: 4 mL via EPIDURAL
  Administered 2016-11-26: 5 mL via EPIDURAL
  Administered 2016-11-26 (×2): 4 mL via EPIDURAL
  Administered 2016-11-26: 5 mL via EPIDURAL

## 2016-11-26 MED ORDER — KETOROLAC TROMETHAMINE 30 MG/ML IJ SOLN
INTRAMUSCULAR | Status: AC
Start: 2016-11-26 — End: 2016-11-27
  Administered 2016-11-27: 30 mg via INTRAVENOUS
  Filled 2016-11-26: qty 1

## 2016-11-26 MED ORDER — DIPHENHYDRAMINE HCL 25 MG PO CAPS: 25.0000 mg | ORAL_CAPSULE | Freq: Four times a day (QID) | ORAL | Status: DC | PRN

## 2016-11-26 MED ORDER — HYDROMORPHONE HCL 1 MG/ML IJ SOLN: 0.2500 mg | INTRAMUSCULAR | Status: DC | PRN

## 2016-11-26 MED ORDER — CHLOROPROCAINE HCL (PF) 3 % IJ SOLN
INTRAMUSCULAR | Status: AC
  Filled 2016-11-26: qty 20

## 2016-11-26 MED ORDER — LACTATED RINGERS IV SOLN
INTRAVENOUS | Status: DC
  Administered 2016-11-26: 08:00:00 via INTRAVENOUS

## 2016-11-26 MED ORDER — SIMETHICONE 80 MG PO CHEW
80.0000 mg | CHEWABLE_TABLET | ORAL | Status: DC | PRN
  Administered 2016-11-28: 80 mg via ORAL

## 2016-11-26 MED ORDER — TETANUS-DIPHTH-ACELL PERTUSSIS 5-2.5-18.5 LF-MCG/0.5 IM SUSP: 0.5000 mL | Freq: Once | INTRAMUSCULAR | Status: DC

## 2016-11-26 MED ORDER — MENTHOL 3 MG MT LOZG: 1.0000 | LOZENGE | OROMUCOSAL | Status: DC | PRN

## 2016-11-26 MED ORDER — OXYCODONE HCL 5 MG PO TABS
10.0000 mg | ORAL_TABLET | ORAL | Status: DC | PRN
  Administered 2016-11-27 – 2016-11-30 (×17): 10 mg via ORAL
  Filled 2016-11-26 (×17): qty 2

## 2016-11-26 MED ORDER — SIMETHICONE 80 MG PO CHEW
80.0000 mg | CHEWABLE_TABLET | Freq: Three times a day (TID) | ORAL | Status: DC
  Administered 2016-11-26 – 2016-11-30 (×12): 80 mg via ORAL
  Filled 2016-11-26 (×13): qty 1

## 2016-11-26 MED ORDER — SODIUM BICARBONATE 8.4 % IV SOLN
INTRAVENOUS | Status: AC
  Filled 2016-11-26: qty 50

## 2016-11-26 MED ORDER — PROMETHAZINE HCL 25 MG/ML IJ SOLN: 6.2500 mg | INTRAMUSCULAR | Status: DC | PRN

## 2016-11-26 MED ORDER — COCONUT OIL OIL
1.0000 "application " | TOPICAL_OIL | Status: DC | PRN
  Administered 2016-11-27: 1 via TOPICAL

## 2016-11-26 MED ORDER — SCOPOLAMINE 1 MG/3DAYS TD PT72: 1.0000 | MEDICATED_PATCH | Freq: Once | TRANSDERMAL | Status: DC

## 2016-11-26 MED ORDER — OXYTOCIN 10 UNIT/ML IJ SOLN
INTRAMUSCULAR | Status: AC
  Filled 2016-11-26: qty 4

## 2016-11-26 MED ORDER — DIPHENHYDRAMINE HCL 50 MG/ML IJ SOLN: 12.5000 mg | INTRAMUSCULAR | Status: DC | PRN

## 2016-11-26 MED ORDER — OXYTOCIN 10 UNIT/ML IJ SOLN
INTRAMUSCULAR | Status: DC | PRN
  Administered 2016-11-26: 40 [IU] via INTRAVENOUS

## 2016-11-26 MED ORDER — SCOPOLAMINE 1 MG/3DAYS TD PT72
1.0000 | MEDICATED_PATCH | TRANSDERMAL | Status: DC
  Administered 2016-11-26: 1.5 mg via TRANSDERMAL
  Filled 2016-11-26: qty 1

## 2016-11-26 MED ORDER — ACETAMINOPHEN 325 MG PO TABS
650.0000 mg | ORAL_TABLET | ORAL | Status: DC | PRN
  Administered 2016-11-28 – 2016-11-30 (×10): 650 mg via ORAL
  Filled 2016-11-26 (×10): qty 2

## 2016-11-26 MED ORDER — PHENYLEPHRINE 8 MG IN D5W 100 ML (0.08MG/ML) PREMIX OPTIME
INJECTION | INTRAVENOUS | Status: DC | PRN
  Administered 2016-11-26: 40 ug/min via INTRAVENOUS

## 2016-11-26 MED ORDER — DEXAMETHASONE SODIUM PHOSPHATE 4 MG/ML IJ SOLN
INTRAMUSCULAR | Status: AC
  Filled 2016-11-26: qty 1

## 2016-11-26 MED ORDER — OXYTOCIN 40 UNITS IN LACTATED RINGERS INFUSION - SIMPLE MED: 2.5000 [IU]/h | INTRAVENOUS | Status: AC

## 2016-11-26 MED ORDER — ONDANSETRON HCL 4 MG/2ML IJ SOLN
INTRAMUSCULAR | Status: AC
  Filled 2016-11-26: qty 2

## 2016-11-26 MED ORDER — ZOLPIDEM TARTRATE 5 MG PO TABS: 5.0000 mg | ORAL_TABLET | Freq: Every evening | ORAL | Status: DC | PRN

## 2016-11-26 MED ORDER — IBUPROFEN 600 MG PO TABS: 600.0000 mg | ORAL_TABLET | Freq: Four times a day (QID) | ORAL | Status: DC | PRN

## 2016-11-26 MED ORDER — ONDANSETRON HCL 4 MG/2ML IJ SOLN: 4.0000 mg | Freq: Three times a day (TID) | INTRAMUSCULAR | Status: DC | PRN

## 2016-11-26 MED ORDER — ONDANSETRON HCL 4 MG/2ML IJ SOLN
INTRAMUSCULAR | Status: DC | PRN
  Administered 2016-11-26: 4 mg via INTRAVENOUS

## 2016-11-26 MED ORDER — FENTANYL CITRATE (PF) 100 MCG/2ML IJ SOLN
INTRAMUSCULAR | Status: DC | PRN
  Administered 2016-11-26: 100 ug via EPIDURAL

## 2016-11-26 MED ORDER — KETOROLAC TROMETHAMINE 30 MG/ML IJ SOLN: 30.0000 mg | Freq: Once | INTRAMUSCULAR | Status: DC | PRN

## 2016-11-26 MED ORDER — DEXAMETHASONE SODIUM PHOSPHATE 4 MG/ML IJ SOLN
INTRAMUSCULAR | Status: DC | PRN
  Administered 2016-11-26: 4 mg via INTRAVENOUS

## 2016-11-26 MED ORDER — SODIUM CHLORIDE 0.9 % IR SOLN
Status: DC | PRN
  Administered 2016-11-26: 1000 mL

## 2016-11-26 MED ORDER — KETOROLAC TROMETHAMINE 30 MG/ML IJ SOLN
30.0000 mg | Freq: Four times a day (QID) | INTRAMUSCULAR | Status: AC | PRN
  Administered 2016-11-26: 30 mg via INTRAMUSCULAR

## 2016-11-26 MED ORDER — IBUPROFEN 600 MG PO TABS
600.0000 mg | ORAL_TABLET | Freq: Four times a day (QID) | ORAL | Status: DC
  Administered 2016-11-26 – 2016-11-30 (×15): 600 mg via ORAL
  Filled 2016-11-26 (×15): qty 1

## 2016-11-26 MED ORDER — CIPROFLOXACIN IN D5W 400 MG/200ML IV SOLN: 400.0000 mg | INTRAVENOUS | Status: DC

## 2016-11-26 MED ORDER — MORPHINE SULFATE (PF) 0.5 MG/ML IJ SOLN
INTRAMUSCULAR | Status: AC
Start: 2016-11-26 — End: 2016-11-26
  Filled 2016-11-26: qty 10

## 2016-11-26 MED ORDER — SOD CITRATE-CITRIC ACID 500-334 MG/5ML PO SOLN
30.0000 mL | Freq: Once | ORAL | Status: AC
  Administered 2016-11-26: 30 mL via ORAL
  Filled 2016-11-26: qty 15

## 2016-11-26 MED ORDER — NALOXONE HCL 0.4 MG/ML IJ SOLN: 0.4000 mg | INTRAMUSCULAR | Status: DC | PRN

## 2016-11-26 MED ORDER — NALOXONE HCL 2 MG/2ML IJ SOSY
1.0000 ug/kg/h | PREFILLED_SYRINGE | INTRAVENOUS | Status: DC | PRN
  Filled 2016-11-26: qty 2

## 2016-11-26 MED ORDER — SIMETHICONE 80 MG PO CHEW
80.0000 mg | CHEWABLE_TABLET | ORAL | Status: DC
  Administered 2016-11-27 – 2016-11-29 (×4): 80 mg via ORAL
  Filled 2016-11-26 (×4): qty 1

## 2016-11-26 SURGICAL SUPPLY — 36 items
APL SKNCLS STERI-STRIP NONHPOA (GAUZE/BANDAGES/DRESSINGS) ×1
BENZOIN TINCTURE PRP APPL 2/3 (GAUZE/BANDAGES/DRESSINGS) ×2 IMPLANT
CHLORAPREP W/TINT 26ML (MISCELLANEOUS) ×3 IMPLANT
CLAMP CORD UMBIL (MISCELLANEOUS) IMPLANT
CLOSURE WOUND 1/2 X4 (GAUZE/BANDAGES/DRESSINGS) ×1
CLOTH BEACON ORANGE TIMEOUT ST (SAFETY) ×3 IMPLANT
DRSG OPSITE POSTOP 4X10 (GAUZE/BANDAGES/DRESSINGS) ×3 IMPLANT
ELECT REM PT RETURN 9FT ADLT (ELECTROSURGICAL) ×3
ELECTRODE REM PT RTRN 9FT ADLT (ELECTROSURGICAL) ×1 IMPLANT
EXTRACTOR VACUUM KIWI (MISCELLANEOUS) IMPLANT
GLOVE BIO SURGEON STRL SZ 6.5 (GLOVE) ×2 IMPLANT
GLOVE BIO SURGEONS STRL SZ 6.5 (GLOVE) ×1
GLOVE BIOGEL PI IND STRL 7.0 (GLOVE) ×1 IMPLANT
GLOVE BIOGEL PI INDICATOR 7.0 (GLOVE) ×2
GOWN STRL REUS W/TWL LRG LVL3 (GOWN DISPOSABLE) ×6 IMPLANT
KIT ABG SYR 3ML LUER SLIP (SYRINGE) IMPLANT
NDL HYPO 25X5/8 SAFETYGLIDE (NEEDLE) IMPLANT
NEEDLE HYPO 25X5/8 SAFETYGLIDE (NEEDLE) IMPLANT
NS IRRIG 1000ML POUR BTL (IV SOLUTION) ×3 IMPLANT
PACK C SECTION WH (CUSTOM PROCEDURE TRAY) ×3 IMPLANT
PAD OB MATERNITY 4.3X12.25 (PERSONAL CARE ITEMS) ×3 IMPLANT
PENCIL SMOKE EVAC W/HOLSTER (ELECTROSURGICAL) ×3 IMPLANT
RTRCTR C-SECT PINK 25CM LRG (MISCELLANEOUS) ×3 IMPLANT
STRIP CLOSURE SKIN 1/2X4 (GAUZE/BANDAGES/DRESSINGS) ×1 IMPLANT
SUT CHROMIC 1 CTX 36 (SUTURE) ×6 IMPLANT
SUT PLAIN 0 NONE (SUTURE) IMPLANT
SUT PLAIN 2 0 XLH (SUTURE) ×3 IMPLANT
SUT VIC AB 0 CT1 27 (SUTURE) ×6
SUT VIC AB 0 CT1 27XBRD ANBCTR (SUTURE) ×2 IMPLANT
SUT VIC AB 2-0 CT1 27 (SUTURE) ×3
SUT VIC AB 2-0 CT1 TAPERPNT 27 (SUTURE) ×1 IMPLANT
SUT VIC AB 3-0 CT1 27 (SUTURE)
SUT VIC AB 3-0 CT1 TAPERPNT 27 (SUTURE) IMPLANT
SUT VIC AB 4-0 KS 27 (SUTURE) ×3 IMPLANT
TOWEL OR 17X24 6PK STRL BLUE (TOWEL DISPOSABLE) ×3 IMPLANT
TRAY FOLEY BAG SILVER LF 14FR (SET/KITS/TRAYS/PACK) ×3 IMPLANT

## 2016-11-26 NOTE — Lactation Note (Addendum)
This note was copied from a baby's chart. Lactation Consultation Note  Patient Name: Joanna Galvan ZOXWR'U Date: 11/26/2016 Reason for consult: Follow-up assessment  Mom ready for consult. "Joanna Galvan" is 34 hours old & Mom feels that he has only had 2 good feedings. Joanna Galvan was placed at the bare breast to latch, but was too sleepy. He did latch briefly with a few sucks before coming off the breast.   I assisted in doing hand expression & fed colostrum to [place] with a gloved finger. Mom's colostrum noted to be greenish-brown (from both breasts).   Mom placed in shells and encouraged to wear them between feedings.   This dyad may benefit from a nipple shield. On oral exam, palate seems somewhat higher & narrow. Joanna Galvan also does not seem to have a strong suck.  Size 21 flanges provided to RN in case she gets Mom pumping overnight. Lurline Hare [facility] 11/26/2016, 10:35 PM

## 2016-11-26 NOTE — Transfer of Care (Signed)
Immediate Anesthesia Transfer of Care Note  Patient: Joanna Galvan  Procedure(s) Performed: Procedure(s) with comments: CESAREAN SECTION (N/A) - Odelia Gage, RNFA  Patient Location: PACU  Anesthesia Type:Epidural  Level of Consciousness: awake  Airway & Oxygen Therapy: Patient Spontanous Breathing  Post-op Assessment: Report given to RN and Post -op Vital signs reviewed and stable  Post vital signs: Reviewed and stable  Last Vitals:  Vitals:   11/26/16 0553  BP: 123/73  Pulse: 81  Resp: 17  Temp: 36.7 C    Last Pain:  Vitals:   11/26/16 0553  TempSrc: Oral         Complications: No apparent anesthesia complications

## 2016-11-26 NOTE — Lactation Note (Signed)
This note was copied from a baby's chart. Lactation Consultation Note  Patient Name: Boy Shantasia Hunnell ZHYQM'[id number] Date: 11/26/2016 Reason for consult: Initial assessment  Initial visit attempted at 8 hours of life, but the arrival of a visitor cut the consult short. Mom has my # to call when ready to complete consult.  Other: Infant had just finished a feeding on the L breast as I entered the room. Mom does not report breast changes w/pregnancy.   Lurline Hare [facility] 11/26/2016, 3:59 PM

## 2016-11-26 NOTE — Plan of Care (Signed)
Problem: Activity: Goal: Ability to tolerate increased activity will improve Ambulated to bathroom and tolerated OOB well.

## 2016-11-26 NOTE — Plan of Care (Signed)
Problem: Nutritional: Goal: Dietary intake will improve Outcome: Progressing Tolerated regular diet well.

## 2016-11-26 NOTE — Anesthesia Procedure Notes (Signed)
Epidural Patient location during procedure: OR Start time: 11/26/2016 7:26 AM  Staffing Anesthesiologist: Mal Amabile Performed: anesthesiologist   Preanesthetic Checklist Completed: patient identified, site marked, surgical consent, pre-op evaluation, timeout performed, IV checked, risks and benefits discussed and monitors and equipment checked  Epidural Patient position: sitting Prep: site prepped and draped and DuraPrep Patient monitoring: continuous pulse ox and blood pressure Approach: midline Location: L4-L5 Injection technique: LOR air  Needle:  Needle type: Tuohy  Needle gauge: 17 G Needle length: 9 cm and 9 Needle insertion depth: 6 cm Catheter type: closed end flexible Catheter size: 19 Gauge Catheter at skin depth: 11 cm Test dose: negative and Other  Assessment Events: blood not aspirated, injection not painful, no injection resistance, negative IV test and no paresthesia  Additional Notes Patient tolerated procedure well. Reason for block:surgical anesthesia

## 2016-11-26 NOTE — Op Note (Signed)
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  Apgars 8,9.  Weight pending.  Uterus appeared abnormal with one dominant horn but adnexa bilaterally and one side in normal cornual location, other adnexa midway down fundus.  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   Specimen Placenta to pathology  Procedure Note  Patient was taken to the operating room where epidural anesthesia was obtained and found to be adequate by Allis clamp test. She was prepped and draped in the normal sterile fashion in the dorsal supine position with a leftward tilt. An appropriate time out was performed. A Pfannenstiel skin incision was then made with the scalpel and carried through to the underlying layer of fascia by sharp dissection and Bovie cautery. The fascia was nicked in the midline and the incision was extended laterally with Mayo scissors. The inferior aspect of the incision was grasped Coker clamps and dissected off the underlying rectus muscles. In a similar fashion the superior aspect was dissected off the rectus muscles. Rectus muscles were separated in the midline and the peritoneal cavity entered bluntly. The peritoneal incision was then extended both superiorly and inferiorly with careful attention to avoid both bowel and bladder. The Alexis self-retaining wound retractor was then placed within the incision and the lower uterine segment exposed. The bladder flap was developed with Metzenbaum scissors and pushed away from the lower uterine segment. The lower  uterine segment was then incised in a transverse fashion and the cavity itself entered bluntly. The incision was extended bluntly. The infant's bottom was then lifted and delivered from the incision without difficulty. The remainder of the infant delivered with the head in a flexed position and the nose and mouth bulb suctioned with the cord clamped and cut as well. The infant was handed off to the waiting pediatricians. The placenta was then spontaneously expressed from the uterus and the uterus cleared of all clots and debris with moist lap sponge. The uterine incision was then repaired in 2 layers the first layer was a running locked layer 1-0 chromic and the second an imbricating layer of the same suture. The tubes and ovaries were inspected and the anomalies as above noted. The gutters cleared of all clots and debris. The uterine incision was inspected and found to be hemostatic. All instruments and sponges as well as the Alexis retractor were then removed from the abdomen. The rectus muscles and peritoneum were then reapproximated with several interrupted mattress sutures of 2-0 Vicryl. The fascia was then closed with 0 Vicryl in a running fashion. Subcutaneous tissue was reapproximated with 3-0 plain in a running fashion. The skin was closed with a subcuticular stitch of 4-0 Vicryl on a Keith needle and then reinforced with benzoin and Steri-Strips. At the conclusion of the procedure all instruments and sponge counts were correct. Patient was taken to the recovery room in good condition with her baby accompanying her skin to skin.

## 2016-11-26 NOTE — Anesthesia Postprocedure Evaluation (Signed)
Anesthesia Post Note  Patient: Joanna Galvan  Procedure(s) Performed: Procedure(s) (LRB): CESAREAN SECTION (N/A)  Patient location during evaluation: Mother Baby Anesthesia Type: Epidural Level of consciousness: awake and alert and oriented Pain management: pain level controlled Vital Signs Assessment: post-procedure vital signs reviewed and stable Respiratory status: spontaneous breathing and nonlabored ventilation Cardiovascular status: stable Postop Assessment: no headache, patient able to bend at knees, no backache, no signs of nausea or vomiting, epidural receding and adequate PO intake Anesthetic complications: no        Last Vitals:  Vitals:   11/26/16 1100 11/26/16 1246  BP: 106/60 104/62  Pulse: 67 62  Resp: 18 18  Temp: 36.9 C 36.9 C    Last Pain:  Vitals:   11/26/16 1246  TempSrc: Axillary  PainSc:    Pain Goal:                 [place]

## 2016-11-26 NOTE — Addendum Note (Signed)
Addendum  created 11/26/16 1346 by Elgie Congo, CRNA   Sign clinical note

## 2016-11-26 NOTE — Anesthesia Postprocedure Evaluation (Signed)
Anesthesia Post Note  Patient: Joanna Galvan  Procedure(s) Performed: Procedure(s) (LRB): CESAREAN SECTION (N/A)  Anesthesia Type: Epidural Level of consciousness: awake Pain management: pain level controlled Vital Signs Assessment: post-procedure vital signs reviewed and stable Respiratory status: spontaneous breathing Cardiovascular status: stable Postop Assessment: no headache, no backache, epidural receding, patient able to bend at knees and no signs of nausea or vomiting Anesthetic complications: no        Last Vitals:  Vitals:   11/26/16 0930 11/26/16 0945  BP: 115/66   Pulse: 79   Resp: 18   Temp:  (P) 36.7 C    Last Pain:  Vitals:   11/26/16 0945  TempSrc: (P) Oral  PainSc: (P) 2    Pain Goal:                 Jeriel Vivanco JR,JOHN Jager Koska

## 2016-11-27 ENCOUNTER — Encounter (HOSPITAL_COMMUNITY): Payer: Self-pay | Admitting: Obstetrics and Gynecology

## 2016-11-27 LAB — CBC
HCT: 26.7 % — ABNORMAL LOW (ref 36.0–46.0)
Hemoglobin: 8.8 g/dL — ABNORMAL LOW (ref 12.0–15.0)
MCH: 25.1 pg — ABNORMAL LOW (ref 26.0–34.0)
MCHC: 33 g/dL (ref 30.0–36.0)
MCV: 76.1 fL — AB (ref 78.0–100.0)
PLATELETS: 176 10*3/uL (ref 150–400)
RBC: 3.51 MIL/uL — ABNORMAL LOW (ref 3.87–5.11)
RDW: 14.6 % (ref 11.5–15.5)
WBC: 16.4 10*3/uL — AB (ref 4.0–10.5)

## 2016-11-27 LAB — TYPE AND SCREEN
ABO/RH(D): A NEG
Antibody Screen: NEGATIVE

## 2016-11-27 MED ORDER — RHO D IMMUNE GLOBULIN 1500 UNIT/2ML IJ SOSY
300.0000 ug | PREFILLED_SYRINGE | Freq: Once | INTRAMUSCULAR | Status: AC
  Administered 2016-11-27: 300 ug via INTRAVENOUS
  Filled 2016-11-27: qty 2

## 2016-11-27 NOTE — Progress Notes (Signed)
Bladder scanned with 563 reading, in out cathed...removed urine. Notable relief in the firmness of whole abdomen. Will continue to monitor.

## 2016-11-27 NOTE — Progress Notes (Signed)
Called by pt's primary rn to assess pt due to rigid abdomen. Rigid abodmen palpated. Fundus firm at umbilicus, bleeding scant, +BS and passing gas. Pt states pain is minimal. Has not voided since foley was taken out after 2200. Help pt to BR, unable to void. Blood scan to be done. Will continue to assess

## 2016-11-27 NOTE — Lactation Note (Signed)
This note was copied from a baby's chart. Lactation Consultation Note  Patient Name: Joanna Galvan RUEAV'W Date: 11/27/2016 Reason for consult: Follow-up assessment;Infant < 6lbs Baby awake and cueing.  Assisted with positioning baby in cross cradle hold on right breast.  Nipple nicely everted when shell removed.  Baby latched easily and nursed actively with audible swallows.  Stressed importance of waking techniques and breast massage during feeding.  Instructed to allow baby to nurse until baby finishes then offer other breast.  Instructed to post pump every 3 hours.  Encouraged to call with concerns/assist prn.  Maternal Data    Feeding Feeding Type: Breast Fed Length of feed: 10 min  LATCH Score/Interventions Latch: Grasps breast easily, tongue down, lips flanged, rhythmical sucking. Intervention(s): Skin to skin;Teach feeding cues;Waking techniques Intervention(s): Breast compression;Breast massage;Assist with latch;Adjust position  Audible Swallowing: A few with stimulation Intervention(s): Alternate breast massage  Type of Nipple: Everted at rest and after stimulation Intervention(s): Shells  Comfort (Breast/Nipple): Soft / non-tender     Hold (Positioning): Assistance needed to correctly position infant at breast and maintain latch. Intervention(s): Breastfeeding basics reviewed;Support Pillows;Position options;Skin to skin  LATCH Score: 8  Lactation Tools Discussed/Used     Consult Status      Huston Foley 11/27/2016, 4:19 PM

## 2016-11-27 NOTE — Progress Notes (Signed)
Subjective: Postpartum Day #1: Cesarean Delivery Patient reports incisional pain and tolerating PO.  Required in and out cath, has not voided yet  Objective: Vital signs in last 24 hours: Temp:  [97.6 F (36.4 C)-99 F (37.2 C)] 98.2 F (36.8 C) (04/11 0500) Pulse Rate:  [62-89] 65 (04/11 0500) Resp:  [18-20] 18 (04/11 0500) BP: (97-127)/(50-75) 105/58 (04/11 0500) SpO2:  [94 %-100 %] 97 % (04/10 2130)  Physical Exam:  General: alert Lochia: appropriate Uterine Fundus: firm Incision: dressing C/D/I    Recent Labs  11/25/16 0945 11/27/16 0520  HGB 10.6* 8.8*  HCT 32.4* 26.7*    Assessment/Plan: Status post Cesarean section. Doing well postoperatively.  Continue current care, ambulate, will see if she voids-may need foley cath for a day.  Leighton Roach Avalynn Bowe 11/27/2016, 8:41 AM

## 2016-11-27 NOTE — Lactation Note (Signed)
This note was copied from a baby's chart. Lactation Consultation Note  Patient Name: Joanna Galvan ZOXWR'U Date: 11/27/2016  [name] is now 24 hours old.  Mom states baby has had about 5 good feedings.  Baby prefers left breast.  Mom is wearing shells and nipples erect when removing shells.  Baby is currently sleeping and fed 2 hours ago.  Discussed with mom that due to slow start and under 6 pounds that pumping with the symphony pump would be beneficial for milk supply.  DEBP set up and initiated.  Curved tip syringe brought to room.  We will supplement with any expressed milk obtained.  Instructed to call for feeding assist when baby starts to cue so we can assist with right side.   Maternal Data    Feeding    LATCH Score/Interventions                      Lactation Tools Discussed/Used     Consult Status      Huston Foley 11/27/2016, 2:52 PM

## 2016-11-28 ENCOUNTER — Encounter (HOSPITAL_COMMUNITY): Payer: Self-pay | Admitting: Neonatology

## 2016-11-28 LAB — BASIC METABOLIC PANEL
ANION GAP: 7 (ref 5–15)
BUN: 11 mg/dL (ref 6–20)
CO2: 26 mmol/L (ref 22–32)
Calcium: 8.7 mg/dL — ABNORMAL LOW (ref 8.9–10.3)
Chloride: 104 mmol/L (ref 101–111)
Creatinine, Ser: 0.7 mg/dL (ref 0.44–1.00)
GFR calc Af Amer: >60 mL/min (ref >60)
Glucose, Bld: 87 mg/dL (ref 65–99)
POTASSIUM: 4 mmol/L (ref 3.5–5.1)
SODIUM: 137 mmol/L (ref 135–145)

## 2016-11-28 LAB — CBC
HEMATOCRIT: 32.4 % — AB (ref 36.0–46.0)
Hemoglobin: 10.4 g/dL — ABNORMAL LOW (ref 12.0–15.0)
MCH: 24.6 pg — ABNORMAL LOW (ref 26.0–34.0)
MCHC: 32.1 g/dL (ref 30.0–36.0)
MCV: 76.8 fL — ABNORMAL LOW (ref 78.0–100.0)
Platelets: 212 10*3/uL (ref 150–400)
RBC: 4.22 MIL/uL (ref 3.87–5.11)
RDW: 14.9 % (ref 11.5–15.5)
WBC: 13.1 10*3/uL — AB (ref 4.0–10.5)

## 2016-11-28 LAB — RH IG WORKUP (INCLUDES ABO/RH)
ABO/RH(D): A NEG
FETAL SCREEN: NEGATIVE
Gestational Age(Wks): 37.4
Unit division: 0

## 2016-11-28 MED ORDER — FUROSEMIDE 10 MG/ML IJ SOLN
20.0000 mg | Freq: Once | INTRAMUSCULAR | Status: AC
  Administered 2016-11-28: 20 mg via INTRAVENOUS
  Filled 2016-11-28: qty 2

## 2016-11-28 MED ORDER — HYDROMORPHONE HCL 1 MG/ML IJ SOLN
0.5000 mg | Freq: Once | INTRAMUSCULAR | Status: AC
  Administered 2016-11-28: 0.5 mg via INTRAVENOUS
  Filled 2016-11-28: qty 1

## 2016-11-28 NOTE — Progress Notes (Signed)
Spoke with Dr. Nino Parsley. Reported patients concerns with fluid in legs and hips area. Md ordered one time dose of Lasix . MD ordered foley to be removed 0600 11/29/2016.

## 2016-11-28 NOTE — Progress Notes (Signed)
CLINICAL SOCIAL WORK MATERNAL/CHILD NOTE  Patient Details  Name: Joanna Galvan MRN: [id number] Date of Birth: 11/26/2015  Date:  11/28/2016  Clinical Social Worker Initiating Note:  Byran Bilotti, LCSW Date/ Time Initiated:  11/28/16/1623     Child's Name:  Joanna Galvan   Legal Guardian:  Mother   Need for Interpreter:  None   Date of Referral:  11/28/16     Reason for Referral:  Other (Comment), Current Substance Use/Substance Use During Pregnancy  (Hx of Anx, marijuana use.)   Referral Source:  Central Nursery   Address:  [address] [place], [place] [address]  Phone number:  [phone]   Household Members:  Minor Children, Siblings (MOB reports that she lives with her 34 year old sister and her sisters 5 year old daughter and 4 year old son.)   Natural Supports (not living in the home):  Friends, Immediate Family, Parent (MGM is MOB's support person and appears very involved and supportive.)   Professional Supports: None (MOB has had counseling in the past and is interested in resouces in needed in the future.)   Employment:     Type of Work:     Education:      Financial Resources:  Private Insurance   Other Resources:      Cultural/Religious Considerations Which May Impact Care: None stated.  MOB's facesheet notes religion as [employer].  Strengths:  Ability to meet basic needs , Pediatrician chosen , Home prepared for child  (Dr. Rubin)   Risk Factors/Current Problems:  Mental Health Concerns    Cognitive State:  Able to Concentrate , Alert , Linear Thinking , Insightful , Goal Oriented    Mood/Affect:  Calm , Comfortable , Interested , Euthymic    CSW Assessment: CSW met with MOB in her first floor room/130 to offer support and complete assessment for hx of anxiety and marijuana use.  MOB was very pleasant and welcoming of CSW's visit.  CSW found MOB to be easy to engage.  MGM was present and MOB gave consent to speak openly with her there.    MOB reports she was "petrified" of having a c-section, so "having a c-section was my worst nightmare come true."  But, MOB reports that she is excited about baby.  CSW congratulated her on the birth of her baby, validated her feeling, and asked if she would like to process her feeling surrounding her report of feeling petrified.  MOB shared her birth experience and how she found out she would need to have a c-section approximately 2 weeks before delivery.  CSW acknowledged that MOB has successfully come through this fear and is now on the other side of it.   MOB reports that she has a good support system, mainly in her mother and sister.  She reports FOB is not involved and that she is "better off" this way.  She reports that he is aware that she was pregnant, and that he stated he did not want to be involved.  MOB appears unconcerned with FOB's lack of involvement.   MOB reports that she has "everything and more" needed for baby at home.  She is aware of SIDS precautions as reviewed by CSW.  She states baby has a crib in his nursery and commits to putting him to bed on his back in his crib at all times.  She reports that there is no smoking in the home. CSW inquired about MOB's mental health and substance use.  MOB reports being   diagnosed with Anxiety, Depression, Bipolar and Borderline Personality Disorder.  She identifies symptoms of Anxiety and Depression, but states that she was reassessed after her teenage years and the Bipolar dx was taken away and she was more appropriately identified as having ADHD.  She states she has never really understood the dx of Borderline Personality, but states she was diagnosed at age 15, which MGM confirmed.  She summarized that she was "just a bad teenager."  MOB and MGM state no symptoms of Mania or Borderline Personality at this point in MOB's life.  MGM added that MOB was very "defiant" as a teenager.  She reports that she took Adderall and Klonopin prior to pregnancy  and that she did not like antidepressants because they made her feel emotionless.  She states Adderall and Klonopin work for her but that she discontinued Adderall in pregnancy and cut down her PRN dose of Klonopin to 1/4 of a tablet when absolutely necessary.  MOB plans to breast feed.  CSW explained that importance of breast feeding as well as maternal mental health.  MOB stated agreement and plans to restart medications as needed.  She does not feel like she needs to restart counseling at this time, but wanted resources in the event she would like to go back to counseling.  CSW provided her with how to find a counselor as well as a new mom checklist as a way to self evaluate, and information about support groups held [facility].  CSW also provided education on PMADs and stressed the importance of talking with a medical professional if concerns are noted at any time.  MOB was very attentive and states agreement.  She states she feels comfortable talking with her OB if she has concerns.  MGM added that MOB's sister had PPD after her second child and MOB acknowledges that she is at a greater risk having had mental health issues in the past. CSW asked MOB about marijuana use noted in her chart.  MOB denies any use in pregnancy and reports that her last use was approximately 1 year ago.  She was understanding of hospital drug screen policy.  Baby's UDS is negative.  CSW will monitor CDS results and make report accordingly.  CSW Plan/Description:  Information/Referral to Community Resources , No Further Intervention Required/No Barriers to Discharge, Patient/Family Education     Terryon Pineiro Elizabeth, LCSW 11/28/2016, 4:26 PM  

## 2016-11-28 NOTE — Progress Notes (Signed)
Patient ID: Joanna Galvan, female   DOB: [birth year], 26 y.o.   MRN: [id number] Pt had episode at about 2pm of increased burning pain at lower left abdomen.  She states she was due for her pain meds and had ambulated the halls doing well for one lap, then had onset of pain that was unbearable and burning on left side.  Foley cath placed after bladder scan showed given possibility of incomplete emptying after had urinary retention POD #1.  Foley placed with 400-639ml drained in first 30 min.  Pt had IV replaced and given one dose of dilaudid to allow po meds to catch up.    Incision C/D/I No palpable bulge  CBC and CMP WNL  Now pt resting in bed comfortably, about to get next oral dose of ibuprofen.  Foley draining and I asked pt to stand with me in room.  She was able to get up and did experience some burning on the left after up a few minutes, but not as severe.  Will leave foley in place until AM but have patient increase her ambulation as well this pm when meds are on board.

## 2016-11-28 NOTE — Progress Notes (Signed)
Richardson notified regarding back spasms and stabbing pain on left and right abd. Pain not controled with current PO meds. Bladder scan was .  MD ordered foley catheter to be inserted, labs to be drawn, and IV pain med- see new orders.  Will continue to monitor.

## 2016-11-28 NOTE — Lactation Note (Addendum)
This note was copied from a baby's chart. Lactation Consultation Note Baby had 68% weight loss at 40 hrs of age. Had 6 voids and 5 stools. Mom has flat nipples w/o stimulation. Wearing #20 NS. Wears shells between feedings. Has DEBP set up, not pumping every 3 hrs. Encouraged to pump Q3 hrs, then hand express colostrum and give to baby. Hand expressed w/drop of colostrum. Stressed importance of pumping, hand expressing, and I&O. Started supplementing w/Alimentum w/slow flow nipple. Gave feeding supplementing sheet guidelines according to age. Baby's lips dry.  Patient Name: Joanna Galvan ZOXWR'U Date: 11/28/2016 Reason for consult: Follow-up assessment;Infant weight loss   Maternal Data    Feeding Feeding Type: Formula Nipple Type: Slow - flow  LATCH Score/Interventions    Intervention(s): Hand expression Intervention(s): Skin to skin;Hand expression;Alternate breast massage  Type of Nipple: Flat Intervention(s): Double electric pump;Shells  Comfort (Breast/Nipple): Soft / non-tender     Intervention(s): Breastfeeding basics reviewed;Support Pillows;Position options;Skin to skin     Lactation Tools Discussed/Used Tools: Pump;Shells;Nipple Shields Nipple shield size: 20 Shell Type: Inverted Breast pump type: Double-Electric Breast Pump Pump Review: Setup, frequency, and cleaning;Milk Storage Initiated by:: RN Date initiated:: 11/27/16   Consult Status Consult Status: Follow-up Date: 11/28/16 Follow-up type: In-patient    Kelton Bultman, Diamond Nickel 11/28/2016, 3:22 AM

## 2016-11-28 NOTE — Progress Notes (Signed)
Subjective: Postpartum Day 2 Cesarean Delivery Patient reports incisional pain, tolerating PO and no problems voiding.   Working on feed with baby who has required supplementation for weight loss  Objective: Vital signs in last 24 hours: Temp:  [98.2 F (36.8 C)-98.4 F (36.9 C)] 98.4 F (36.9 C) (04/12 0518) Pulse Rate:  [80-91] 80 (04/12 0518) Resp:  [18] 18 (04/12 0518) BP: (113-114)/(67-70) 114/70 (04/12 0518) SpO2:  [98 %] 98 % (04/11 1900)  Physical Exam:  General: alert and cooperative Lochia: appropriate Uterine Fundus: firm Incision: C/D/I    Recent Labs  11/25/16 0945 11/27/16 0520  HGB 10.6* 8.8*  HCT 32.4* 26.7*    Assessment/Plan: Status post Cesarean section. Doing well postoperatively.  Continue current care, will d/c tomorrow as peds wants baby to be feeding better for d/c.  Plans circumcision in office  Oliver Pila 11/28/2016, 9:38 AM

## 2016-11-29 MED ORDER — OXYCODONE HCL 5 MG PO TABS: 5.0000 mg | ORAL_TABLET | Freq: Four times a day (QID) | ORAL | 0 refills | Status: DC | PRN

## 2016-11-29 MED ORDER — IBUPROFEN 800 MG PO TABS: 800.0000 mg | ORAL_TABLET | Freq: Three times a day (TID) | ORAL | 1 refills | Status: DC | PRN

## 2016-11-29 MED ORDER — PRENATAL MULTIVITAMIN CH: 1.0000 | ORAL_TABLET | Freq: Every day | ORAL | 3 refills | Status: DC

## 2016-11-29 NOTE — Progress Notes (Signed)
Foley catheter inserted. See order

## 2016-11-29 NOTE — Progress Notes (Signed)
In and out cath done using sterile techniques, 300 cc clear yellow urine emptied from bladder, patient tolerated procedure well

## 2016-11-29 NOTE — Progress Notes (Signed)
Patient having difficulty emptying bladder.  MD aware and see new orders.

## 2016-11-29 NOTE — Progress Notes (Signed)
Subjective: Postpartum Day 3: Cesarean Delivery Patient reports incisional pain and tolerating PO.  Pain controlled, ambulating.  Feeling much better today.  Hasn't peed yet, will d/c if viding , ambulating and pain controlled  Objective: Vital signs in last 24 hours: Temp:  [97.9 F (36.6 C)-98.2 F (36.8 C)] 98.2 F (36.8 C) (04/13 0610) Pulse Rate:  [79-92] 79 (04/13 0610) Resp:  [18-19] 18 (04/13 0610) BP: (116-118)/(62-71) 118/71 (04/13 0610) SpO2:  [98 %] 98 % (04/12 1809)  Physical Exam:  General: alert and no distress Lochia: appropriate Uterine Fundus: firm Incision: healing well DVT Evaluation: No evidence of DVT seen on physical exam.   Recent Labs  11/27/16 0520 11/28/16 1418  HGB 8.8* 10.4*  HCT 26.7* 32.4*    Assessment/Plan: Status post Cesarean section. Doing well postoperatively.  Discharge home with standard precautions and return to clinic in 2 weeks.d/c with Motrin, percocet and PNV.    Joanna Galvan 11/29/2016, 7:35 AM

## 2016-11-29 NOTE — Progress Notes (Signed)
Dr Ellyn Hack notified regarding output. Will recheck output on next void.  Will continue to monitor.

## 2016-11-29 NOTE — Discharge Summary (Addendum)
OB Discharge Summary     Patient Name: Joanna Galvan DOB: 1991-01-24 MRN: [id number]  Date of admission: 11/26/2016 Delivering MD: Huel Cote   Date of discharge: 11/29/2016  Admitting diagnosis: IUGR 37 weeks 2 vessel cord Intrauterine pregnancy: [redacted]w[redacted]d     Secondary diagnosis:  Active Problems:   Breech presentation delivered   IUGR (intrauterine growth restriction) affecting care of mother, third trimester, fetus 1   Single umbilical artery affecting management of mother in singleton pregnancy, antepartum   S/P primary low transverse C-section  Additional problems: urinary retention     Discharge diagnosis: Term Pregnancy Delivered                                                                                                Post partum procedures:rhogam  Augmentation: N/A  Complications: None  Hospital course:  Sceduled C/S   26 y.o. yo G1P1001 at [redacted]w[redacted]d was admitted to the hospital 11/26/2016 for scheduled cesarean section with the following indication:Malpresentation.  Membrane Rupture Time/Date: 7:57 AM ,11/26/2016   Patient delivered a Viable infant.11/26/2016  Details of operation can be found in separate operative note.  Pateint had an uncomplicated postpartum course.  She is ambulating, tolerating a regular diet, passing flatus, and urinating well. Patient is discharged home in stable condition on  11/29/16         Physical exam  Vitals:   11/27/16 1900 11/28/16 0518 11/28/16 1809 11/29/16 0610  BP: 113/67 114/70 116/62 118/71  Pulse: 91 80 92 79  Resp: Temp: 98.2 F (36.8 C) 98.4 F (36.9 C) 97.9 F (36.6 C) 98.2 F (36.8 C)  TempSrc: Oral Oral Oral   SpO2: 98%  98%   Weight:      Height:       General: alert and no distress Lochia: appropriate Uterine Fundus: firm Incision: Healing well with no significant drainage DVT Evaluation: No evidence of DVT seen on physical exam. Labs: Lab Results  Component Value Date   WBC 13.1 (H)  11/28/2016   HGB 10.4 (L) 11/28/2016   HCT 32.4 (L) 11/28/2016   MCV 76.8 (L) 11/28/2016   PLT 212 11/28/2016   CMP Latest Ref Rng & Units 11/28/2016  Glucose 65 - 99 mg/dL 87  BUN 6 - 20 mg/dL 11  Creatinine 5.78 - 4.69 mg/dL 6.29  Sodium 528 - 413 mmol/L 137  Potassium 3.5 - 5.1 mmol/L 4.0  Chloride 101 - 111 mmol/L 104  CO2 22 - 32 mmol/L 26  Calcium 8.9 - 10.3 mg/dL 2.4(M)  Total Protein 6.5 - 8.1 g/dL -  Total Bilirubin 0.3 - 1.2 mg/dL -  Alkaline Phos 38 - 010 U/L -  AST 15 - 41 U/L -  ALT 14 - 54 U/L -    Discharge instruction: per After Visit Summary and "Baby and Me Booklet".  After visit meds:  Allergies as of 11/29/2016      Reactions   Azithromycin Hives   Bupropion Hives   Cefprozil Hives   Latex Itching, Swelling      Medication List  STOP taking these medications   ferrous sulfate 325 (65 FE) MG EC tablet     TAKE these medications   clonazePAM 0.5 MG tablet Commonly known as:  KLONOPIN Take 0.25 mg by mouth daily as needed for anxiety. anxiety   ibuprofen 800 MG tablet Commonly known as:  ADVIL,MOTRIN Take 1 tablet (800 mg total) by mouth every 8 (eight) hours as needed for moderate pain.   prenatal multivitamin Tabs tablet Take 1 tablet by mouth daily at 12 noon.   VENTOLIN HFA 108 (90 Base) MCG/ACT inhaler Generic drug:  albuterol Inhale 2 puffs into the lungs every 6 (six) hours as needed. Shortness of breath/ wheezing       Diet: routine diet  Activity: Advance as tolerated. Pelvic rest for 6 weeks.   Outpatient follow up:2 and 6 week Follow up Appt:No future appointments. Follow up Visit:No Follow-up on file.  Postpartum contraception: Undecided  Newborn Data: Live born female  Birth Weight: 5 lb 14.2 oz (2670 g) APGAR: 8, 8  Baby Feeding: Breast Disposition:home with mother  Pt unable to pass voiding trials, had high post-void residual catheter replaced overnight.  Voiding trials day POD #4, still with high PVR.  D.W  urology.  Will f/u as outpt.  No need for leaving catheter in.  D/W pt - voices understanding.    11/29/2016 Sherian Rein, MD

## 2016-11-29 NOTE — Lactation Note (Signed)
This note was copied from a baby's chart. Lactation Consultation Note  Patient Name: Joanna Galvan [id number] Date: 11/29/2016 Reason for consult: Follow-up assessment;Infant < 6lbs;Other (Comment) (37.4 wks) Mom had baby latched when LC arrived using 20 nipple shield but baby did not have good depth. LC assisted Mom with re-positioning baby and supporting breast for baby to have more depth with latch. Baby demonstrating good suckling bursts with swallows noted. Colostrum dripping out of baby's mouth after the feeding. Stressed to [employer] the importance of good depth with latch to milk transfer and stimulating breast well to maximize milk production. Baby weight loss at 8.2% but no additional weight loss over the past 24 hours. Advised Mom to BF with each feeding using 20 nipple shield, 8-12 times or more in 24 hours.  Post pump at least 4 times per day for 15 minutes to have EBM to supplement with feedings and to help bring milk to volume. Give baby back EBM received with pumping, supplemental guidelines reviewed with Mom. Mom prefer to use bottle to supplement. Had Dr. Manson Passey #1 at home and reports having DEBP for home use.  Engorgement care reviewed if needed.  OP f/u scheduled with Lactation for Tuesday, 12/03/16 at 3:30 pm.  Call for questions/concerns.   Maternal Data    Feeding Feeding Type: Breast Fed Length of feed: 13 min  LATCH Score/Interventions Latch: Grasps breast easily, tongue down, lips flanged, rhythmical sucking. Intervention(s): Adjust position;Assist with latch;Breast massage  Audible Swallowing: A few with stimulation  Type of Nipple: Everted at rest and after stimulation (short nipple shafts bilateral) Intervention(s): Shells;Double electric pump  Comfort (Breast/Nipple): Filling, red/small blisters or bruises, mild/mod discomfort  Problem noted: Mild/Moderate discomfort (applying EBM/coconut oil to tender nipples, no breakdown observed)  Hold (Positioning): Assistance  needed to correctly position infant at breast and maintain latch.  LATCH Score: 7  Lactation Tools Discussed/Used Tools: Shells;Nipple Dorris Carnes;Pump Nipple shield size: 20 Shell Type: Inverted Breast pump type: Double-Electric Breast Pump   Consult Status Consult Status: Complete Date: 11/29/16 Follow-up type: In-patient    Alfred Levins 11/29/2016, 9:23 AM

## 2016-11-29 NOTE — Progress Notes (Signed)
Pt c/o of "hardening" of lower abd.  Will do post void bladder scan.

## 2016-11-29 NOTE — Progress Notes (Signed)
Patient ID: Joanna Galvan, female   DOB: September 20, 1990, 26 y.o.   MRN: [id number]  Pt with large post-void residuals 300-400, unable to void.  I&O cath x 2; replaced foley overnight, canceled d/c

## 2016-11-30 MED ORDER — NITROFURANTOIN MACROCRYSTAL 100 MG PO CAPS: 100.0000 mg | ORAL_CAPSULE | Freq: Two times a day (BID) | ORAL | 0 refills | Status: AC

## 2016-11-30 MED ORDER — NITROFURANTOIN MACROCRYSTAL 100 MG PO CAPS
100.0000 mg | ORAL_CAPSULE | Freq: Every morning | ORAL | Status: DC
  Administered 2016-11-30: 100 mg via ORAL
  Filled 2016-11-30 (×2): qty 1

## 2016-11-30 NOTE — Lactation Note (Signed)
This note was copied from a baby's chart. Lactation Consultation Note  Patient Name: Joanna Galvan [id number] Date: 11/30/2016 Reason for consult: Follow-up assessment   With this mom of an early term baby, now 60 days old and at 7 % weight loss. I noted that the baby has a posterior short lingual frenulum, that cause his tongue to be very restricted with extension and elevation. I showed this to mom, explained that this contributes to his trouble with a deep latch. Mom said he latched great iniitally, but now not so well. I also explained that since he is small, under 6 pounds, and early, he is probably getting tired, and normal behavior at his gestation and age. Mom has a scheduled O/P lactation appointment, and I told her this issue will be discussed more then, but for now pumping and proper use of a nipple shield is important. Mom's breast are very full, maybe engorged. I advised mom to pump in maintenance setting, to try 21 flanges, with coconut oil, go back to 24 flanges if 21 too tight. Mom is having trouble voiding today, to this is her priority at this time. I will follow up with mom with both latching Marin Comment, and showing mom how to use massage toward her armpits, to decrease engorgement.     Maternal Data    Feeding Feeding Type: Bottle Fed - Formula Nipple Type: Slow - flow  LATCH Score/Interventions          Comfort (Breast/Nipple): Engorged, cracked, bleeding, large blisters, severe discomfort Problem noted: Engorgment Intervention(s): Ice;Hand expression;Reverse pressure  Problem noted: Mild/Moderate discomfort Interventions (Mild/moderate discomfort): Pre-pump if needed        Lactation Tools Discussed/Used Tools: Flanges Flange Size:  (mom to try 21 flanges, after applying coconut oil, and maintenance setting)   Consult Status Consult Status: Follow-up Date: 11/30/16 Follow-up type: In-patient    Alfred Levins 11/30/2016, 9:00 AM

## 2016-11-30 NOTE — Progress Notes (Addendum)
Subjective: Postpartum Day 4: Cesarean Delivery Patient reports incisional pain and tolerating PO.  Nl lochia, pain controlled.  Foley d/c'd this AM.  D/W pt urinary retention, voiding trial today.    Objective: Vital signs in last 24 hours: Temp:  [98 F (36.7 C)-98.5 F (36.9 C)] 98.5 F (36.9 C) (04/14 0605) Pulse Rate:  [87-96] 87 (04/14 0605) Resp:  [18] 18 (04/14 0605) BP: (103-119)/(56-74) 119/74 (04/14 1610)  Physical Exam:  General: alert and no distress Lochia: appropriate Uterine Fundus: firm Incision: healing well DVT Evaluation: No evidence of DVT seen on physical exam.   Recent Labs  11/28/16 1418  HGB 10.4*  HCT 32.4*    Assessment/Plan: Status post Cesarean section. Doing well postoperatively. Urinary retention issues Discharge home after passing voiding trials - with standard precautions and return to clinic in 2 weeks. T/C d/w urology - voiding dysfunction  Darla Mcdonald Bovard-Stuckert 11/30/2016, 8:11 AM

## 2016-11-30 NOTE — Progress Notes (Signed)
Foley DC'd at 0600 as ordered. Pt voided 200cc at 0754, post void bladder scan showed 302cc-498cc- Dr. Ellyn Hack at bedside and reviewing plan of care. Pt up to bathroom to void at 0848 and voided 250cc, bladder scan post void showed 100-236cc residual- attempted to void again but unable-bladder scan shows 138-200cc. Dr. Ellyn Hack called at [phone] and informed of void amounts and post void residual bladder scan amounts. In and out cath done as ordered at 0953 for 210cc. Pt voided every hr x4 hrs per MD instruction, she voided 300cc,150cc,200cc, and 300cc. The post void bladder scan on last void was 239-311cc- attempted to void after bladder scan but unable to void. In and out cath done per Dr. Ellyn Hack orders at 1456 for 200cc.

## 2016-11-30 NOTE — Progress Notes (Signed)
Patient ID: Joanna Galvan, female   DOB: 1991/03/22, 26 y.o.   MRN: [id number]   [facility] urology regarding urinary retention.  PVR = 150-200, cath of 200. Dr Laverle Patter states abnormal, but pt can go w/o catheter.  Recommends f/u urology 2-3 weeks.  PVR can be elevated due to narcotic use and postop.  D/W pt.  Pt declines d/c with catheter, encouraged to void every 2 hours despite no increased urge.     Excellent uop.  Gave some care instructions for retention.   Encouraged to call or return to MAU with problems

## 2016-12-02 ENCOUNTER — Encounter (HOSPITAL_COMMUNITY): Payer: Self-pay

## 2016-12-02 ENCOUNTER — Inpatient Hospital Stay (HOSPITAL_COMMUNITY)
Admission: AD | Admit: 2016-12-02 | Discharge: 2016-12-02 | Disposition: A | Discharge status: Home / Self Care | Payer: BLUE CROSS/BLUE SHIELD | Source: Ambulatory Visit | Attending: Obstetrics and Gynecology | Admitting: Obstetrics and Gynecology

## 2016-12-02 DIAGNOSIS — Z833 Family history of diabetes mellitus: Secondary | ICD-10-CM | POA: Diagnosis not present

## 2016-12-02 DIAGNOSIS — R339 Retention of urine, unspecified: Secondary | ICD-10-CM | POA: Diagnosis present

## 2016-12-02 DIAGNOSIS — F329 Major depressive disorder, single episode, unspecified: Secondary | ICD-10-CM | POA: Diagnosis not present

## 2016-12-02 DIAGNOSIS — K219 Gastro-esophageal reflux disease without esophagitis: Secondary | ICD-10-CM | POA: Diagnosis not present

## 2016-12-02 DIAGNOSIS — Z9889 Other specified postprocedural states: Secondary | ICD-10-CM | POA: Insufficient documentation

## 2016-12-02 DIAGNOSIS — Z888 Allergy status to other drugs, medicaments and biological substances status: Secondary | ICD-10-CM | POA: Insufficient documentation

## 2016-12-02 DIAGNOSIS — Z803 Family history of malignant neoplasm of breast: Secondary | ICD-10-CM | POA: Insufficient documentation

## 2016-12-02 DIAGNOSIS — F909 Attention-deficit hyperactivity disorder, unspecified type: Secondary | ICD-10-CM | POA: Insufficient documentation

## 2016-12-02 DIAGNOSIS — Z79899 Other long term (current) drug therapy: Secondary | ICD-10-CM | POA: Insufficient documentation

## 2016-12-02 DIAGNOSIS — R399 Unspecified symptoms and signs involving the genitourinary system: Secondary | ICD-10-CM

## 2016-12-02 DIAGNOSIS — J45909 Unspecified asthma, uncomplicated: Secondary | ICD-10-CM | POA: Insufficient documentation

## 2016-12-02 DIAGNOSIS — Z87891 Personal history of nicotine dependence: Secondary | ICD-10-CM | POA: Insufficient documentation

## 2016-12-02 DIAGNOSIS — F419 Anxiety disorder, unspecified: Secondary | ICD-10-CM | POA: Diagnosis not present

## 2016-12-02 DIAGNOSIS — O9089 Other complications of the puerperium, not elsewhere classified: Secondary | ICD-10-CM | POA: Diagnosis not present

## 2016-12-02 HISTORY — DX: Ulcerative (chronic) proctitis without complications: K51.20

## 2016-12-02 HISTORY — DX: Depression, unspecified: F32.A

## 2016-12-02 HISTORY — DX: Major depressive disorder, single episode, unspecified: F32.9

## 2016-12-02 MED ORDER — LIDOCAINE HCL 2 % EX GEL: 1.0000 "application " | Freq: Once | CUTANEOUS | Status: DC

## 2016-12-02 NOTE — MAU Note (Signed)
Sent in for placement of indwelling catheter.  Pt taken directly to rm

## 2016-12-02 NOTE — MAU Provider Note (Signed)
History     CSN: [id number]  Arrival date and time: 12/02/16 1816   None     Chief Complaint  Patient presents with  . poss urinary  retention   HPI   Joanna Galvan is a 26 y.o. female G1P1001 status post cesarean section on 4/10 here with urinary retention. She was sent her by Dr. Senaida Ores for possible foley catheter placement.   OB History    Gravida Para Term Preterm AB Living   SAB TAB Ectopic Multiple Live Births         0 1      Past Medical History:  Diagnosis Date  . ADHD (attention deficit hyperactivity disorder)   . Anxiety   . Asthma   . Depression   . GERD (gastroesophageal reflux disease)   . History of ulcerative colitis   . 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 CESAREAN SECTION N/A 11/26/2016   Procedure: CESAREAN SECTION;  Surgeon: Huel Cote, MD;  Location: [facility] BIRTHING SUITES;  Service: Obstetrics;  Laterality: N/A;  Odelia Gage, RNFA  . TONSILLECTOMY      Family History  Problem Relation Age of Onset  . Diabetes Mother   . Diabetes Maternal Grandmother   . Cancer Maternal Grandmother     breast    Social History  Substance Use Topics  . Smoking status: Former Smoker    Types: Cigarettes    Quit date: 12/14/2015  . Smokeless tobacco: Never Used  . Alcohol use Yes     Comment: ocasionally    Allergies:  Allergies  Allergen Reactions  . Azithromycin Hives  . Bupropion Hives  . Buspar [Buspirone]   . Cefprozil Hives  . Latex Itching and Swelling    Prescriptions Prior to Admission  Medication Sig Dispense Refill Last Dose  . clonazePAM (KLONOPIN) 0.5 MG tablet Take 0.25 mg by mouth daily as needed for anxiety. anxiety   11/25/2016 at Unknown time  . ibuprofen (ADVIL,MOTRIN) 800 MG tablet Take 1 tablet (800 mg total) by mouth every 8 (eight) hours as needed for moderate pain. 45 tablet 1   . nitrofurantoin (MACRODANTIN) 100 MG capsule Take 1  capsule (100 mg total) by mouth 2 (two) times daily. 10 capsule 0   . oxyCODONE (OXY IR/ROXICODONE) 5 MG immediate release tablet Take 1 tablet (5 mg total) by mouth every 6 (six) hours as needed for severe pain (pain scale 4-7). 30 tablet 0   . Prenatal Vit-Fe Fumarate-FA (PRENATAL MULTIVITAMIN) TABS tablet Take 1 tablet by mouth daily at 12 noon. 100 tablet 3   . VENTOLIN HFA 108 (90 Base) MCG/ACT inhaler Inhale 2 puffs into the lungs every 6 (six) hours as needed. Shortness of breath/ wheezing  2 Past Week at Unknown time   No results found for this or any previous visit (from the past 48 hour(s)).  Review of Systems  Constitutional: Negative for fever.  Genitourinary: Positive for decreased urine volume.   Physical Exam   Blood pressure 139/79, pulse (!) 104, temperature 98.5 F (36.9 C), temperature source Oral, resp. rate 18, height  (1.575 m), weight 199 lb (90.3 kg), unknown if currently breastfeeding.  Physical Exam  Constitutional: She is oriented to person, place, and time. She appears well-developed and well-nourished. No distress.  Neurological: She is alert and oriented to person, place, and time.  Skin: Skin  is warm. She is not diaphoretic.  Psychiatric: Her behavior is normal.   MAU Course  Procedures  MDM  Lidocaine jelly ordered 16 french Foley ordered  Patient to void in MAU prior to foley cath placement.  Post void bladder scan shows 67 cc.  Dr. Senaida Ores at bedside.   Duane Lope, NP 12/02/2016 7:13 PM   Assessment and Plan  Pt seen and reassured bladder scan only showing 67ml which is WNL.  She was mostly anxious that she was not emptying because she does not have normal sensation with voiding and worried that will not return.  D/w her that more than likely as things were already improving that it is most likely this will as well.  Will go home with hat to measure voids for reassurance and call if persistently less than .   Pain is  well-controlled and incision looks good. Followup in office in one week or prn

## 2016-12-02 NOTE — Progress Notes (Signed)
Pt up to b/r to void. Bladder scan=67cc. Dr Senaida Ores notified.

## 2016-12-03 ENCOUNTER — Ambulatory Visit: Payer: Self-pay

## 2016-12-03 NOTE — Lactation Note (Signed)
This note was copied from a Joanna's chart. Lactation Consult  Mother's reason for visit:  Help with latch Visit Type:  Outpatient Appointment Notes:  Mom reports Joanna has stopped latching to breast and she would like assist with latch.  Consult:  Initial Lactation Consultant:  Joanna Galvan  ________________________________________________________________________ C/S 11/26/16 at 37.4 wks. BW  5 lb. 14.2 oz Last weight check at Peds yesterday: 5 lb.11.0 oz per Mom Initial weight today: 5 lb. 12.0 oz/2608 gm. Joanna Galvan Joanna now 66 days old.  ________________________________________________________________________  Mother's Name: Joanna Galvan Type of delivery:  C-Section, Low Transverse Breastfeeding Experience:  P1 Maternal Medical Conditions:  ADHD, anxiety, asthma, ulcerative colitis, depression, GERD Maternal Medications:  Motrin, Oxycodone prn. Macrobid for UTI, Klonopin prn 0.5mg  - Discussed with Mom LS monitor for sedation poor feeding if taking Klonopin - has not taken yet. RX for Ventolin inhaler prn  ________________________________________________________________________  Breastfeeding History (Post Discharge)  Frequency of breastfeeding:  [name] has stopped latching with or without nipple shield  Mom is pumping every 3 hours and receiving approx 4 oz of EBM. She has Medela DEBP and Ameda, advised to use Medela pump Joanna is being supplemented since not latching with 50 ml of EBM every 2-3 hours via bottle  Infant Intake and Output Assessment  Voids:  8-10 in 24 hrs.  Color:  Clear yellow Stools:  5-8 in 24 hrs.  Color:  Yellow  ________________________________________________________________________  Maternal Breast Assessment  Breast:  Soft Nipple:  Erect Pain level:  0  _______________________________________________________________________ Feeding Assessment/Evaluation  Initial feeding assessment:  Infant's oral assessment:  Variance. Joanna is noted to  have short lingual frenulum but transferred milk well today at this visit.   Positioning:  Cross cradle Right breast  LATCH documentation:  Latch:  2 = Grasps breast easily, tongue down, lips flanged, rhythmical sucking.  Audible swallowing:  2 = Spontaneous and intermittent  Type of nipple:  2 = Everted at rest and after stimulation  Comfort (Breast/Nipple):  2 = Soft / non-tender  Hold (Positioning):  1 = Assistance needed to correctly position infant at breast and maintain latch  LATCH score:  9 LC assisted Mom with positioning, supporting breast with feeding and using breast compression for Joanna to obtain/sustain good depth with latch. Mom has been trying to latch in cradle hold and not supporting her breast. Joanna demonstrated good nutritive suckling bursts with swallows noted  Pre-feed weight:  2608 g  (5 lb. 12.0 oz.) Post-feed weight:  2658 g (5 lb. 13.8 oz.) Amount transferred:  50 ml with nursing for 15 minutes, no nipple shield  LC assisted Mom with latch on left breast using cross cradle hold.  Joanna more fussy but with using breast compression was able to latch and sustain good suckling bursts with swallows. Pre-feed weight:  2658 gm ( 5 lb. 13.8 oz) Post-feed weight:  2688 gm ( 5 lb. 14.8 oz) Amount transferred:  30 ml with nursing for 12 minutes  Total amount transferred:  80 ml  Mom very encouraged.  LC advised Mom to offer breast each feeding - 8-12 times or more in 24 hours. Try to catch early feeding ques. If Joanna fussy and will not latch, give appetizer of 5-10 ml of EBM then re-try breast. If Joanna still will not latch, pump and bottle feed that feeding and try the breast again the following feeding.  If Joanna will latch and nurse for 15-20 minutes both breasts some feedings then based on  milk transfer today Mom does not need to pump/supplement. Advised to be sure and support breast with feedings, use breast compression to help with latch.  Encouraged Mom to call if  desires further assist or has questions/concerns. Encouraged to come to support group on Tuesday's at 11:00 am.

## 2017-01-02 DIAGNOSIS — Q514 Unicornate uterus: Secondary | ICD-10-CM | POA: Insufficient documentation

## 2017-02-12 ENCOUNTER — Other Ambulatory Visit: Payer: Self-pay | Admitting: Obstetrics and Gynecology

## 2017-02-12 DIAGNOSIS — Q514 Unicornate uterus: Secondary | ICD-10-CM

## 2017-02-27 NOTE — Addendum Note (Signed)
Addendum  created 02/27/17 1605 by Mal AmabileFoster, Scotty Weigelt, MD   Sign clinical note

## 2017-02-27 NOTE — Anesthesia Postprocedure Evaluation (Signed)
Anesthesia Post Note  Patient: Joanna OaksMegan H Antwi  Procedure(s) Performed: Procedure(s) (LRB): CESAREAN SECTION (N/A)     Anesthesia Post Evaluation  Last Vitals:  Vitals:   11/29/16 1733 11/30/16 0605  BP: (!) 103/56 119/74  Pulse: 96 87  Resp: 18 18  Temp: 36.7 C 36.9 C    Last Pain:  Vitals:   11/30/16 1723  PainSc: 6                  Jedediah Noda A.

## 2017-03-10 ENCOUNTER — Other Ambulatory Visit: Payer: BLUE CROSS/BLUE SHIELD

## 2017-06-11 ENCOUNTER — Ambulatory Visit (HOSPITAL_COMMUNITY)
Admission: EM | Admit: 2017-06-11 | Discharge: 2017-06-11 | Disposition: A | Discharge status: Home / Self Care | Payer: BLUE CROSS/BLUE SHIELD

## 2018-09-02 ENCOUNTER — Emergency Department (HOSPITAL_BASED_OUTPATIENT_CLINIC_OR_DEPARTMENT_OTHER)
Admission: EM | Admit: 2018-09-02 | Discharge: 2018-09-02 | Disposition: A | Discharge status: Home / Self Care | Payer: BLUE CROSS/BLUE SHIELD | Attending: Emergency Medicine | Admitting: Emergency Medicine

## 2018-09-02 ENCOUNTER — Other Ambulatory Visit: Payer: Self-pay

## 2018-09-02 ENCOUNTER — Encounter (HOSPITAL_BASED_OUTPATIENT_CLINIC_OR_DEPARTMENT_OTHER): Payer: Self-pay | Admitting: Emergency Medicine

## 2018-09-02 DIAGNOSIS — Z91040 Latex allergy status: Secondary | ICD-10-CM | POA: Insufficient documentation

## 2018-09-02 DIAGNOSIS — Z87891 Personal history of nicotine dependence: Secondary | ICD-10-CM | POA: Insufficient documentation

## 2018-09-02 DIAGNOSIS — F909 Attention-deficit hyperactivity disorder, unspecified type: Secondary | ICD-10-CM | POA: Insufficient documentation

## 2018-09-02 DIAGNOSIS — Z79899 Other long term (current) drug therapy: Secondary | ICD-10-CM | POA: Insufficient documentation

## 2018-09-02 DIAGNOSIS — H66001 Acute suppurative otitis media without spontaneous rupture of ear drum, right ear: Secondary | ICD-10-CM | POA: Insufficient documentation

## 2018-09-02 DIAGNOSIS — J45909 Unspecified asthma, uncomplicated: Secondary | ICD-10-CM | POA: Insufficient documentation

## 2018-09-02 MED ORDER — HYDROCODONE-ACETAMINOPHEN 5-325 MG PO TABS: 1.0000 | ORAL_TABLET | ORAL | 0 refills | Status: DC | PRN

## 2018-09-02 MED ORDER — ACETAMINOPHEN 325 MG PO TABS
650.0000 mg | ORAL_TABLET | Freq: Once | ORAL | Status: AC
  Administered 2018-09-02: 650 mg via ORAL
  Filled 2018-09-02: qty 2

## 2018-09-02 MED ORDER — CLINDAMYCIN HCL 150 MG PO CAPS
300.0000 mg | ORAL_CAPSULE | Freq: Once | ORAL | Status: AC
  Administered 2018-09-02: 300 mg via ORAL
  Filled 2018-09-02: qty 2

## 2018-09-02 MED ORDER — CLINDAMYCIN HCL 300 MG PO CAPS: 300.0000 mg | ORAL_CAPSULE | Freq: Four times a day (QID) | ORAL | 0 refills | Status: DC

## 2018-09-02 NOTE — ED Triage Notes (Signed)
Pt c/o 9/10 right ear pain for the past 2 hours.

## 2018-09-02 NOTE — ED Provider Notes (Signed)
[facility] Provider Note: Joanna DellJ. Lane Laveah Gloster, MD, FACEP  CSN: [id number] MRN: [id number] ARRIVAL: 09/02/18 at 0132 ROOM: MH10/MH10   CHIEF COMPLAINT  Ear Pain   HISTORY OF PRESENT ILLNESS  09/02/18 1:52 AM Joanna Galvan is a 28 y.o. female with right ear pain for the past 2 hours.  She rates her pain as a 9 out of 10.  The pain woke her from sleep.  She had been having nasal congestion and sinus pressure for the past several days.  She has taken ibuprofen and a decongestant with out relief of the pain.  She has not had a fever.  He has a history of rupture of the right tympanic membrane twice.   Past Medical History:  Diagnosis Date  . ADHD (attention deficit hyperactivity disorder)   . Anxiety   . Asthma   . Depression   . GERD (gastroesophageal reflux disease)   . History of ulcerative colitis   . 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) [facility](HCC)     Past Surgical History:  Procedure Laterality Date  . CESAREAN SECTION N/A 11/26/2016   Procedure: CESAREAN SECTION;  Surgeon: Joanna CoteKathy Richardson, MD;  [facility];  Service: Obstetrics;  Laterality: N/A;  Joanna Galvan, RNFA  . TONSILLECTOMY      Family History  Problem Relation Age of Onset  . Diabetes Mother   . Diabetes Maternal Grandmother   . Cancer Maternal Grandmother        breast    Social History   Tobacco Use  . Smoking status: Former Smoker    Types: Cigarettes    Last attempt to quit: 12/14/2015    Years since quitting: 2.7  . Smokeless tobacco: Never Used  Substance Use Topics  . Alcohol use: Yes    Comment: ocasionally  . Drug use: Yes    Types: Marijuana    Comment: last used 11/2015    Prior to Admission medications   Medication Sig Start Date End Date Taking? Authorizing Provider  clonazePAM (KLONOPIN) 0.5 MG tablet Take 0.25 mg by mouth daily as needed for anxiety. anxiety 07/10/15   [provider]  ibuprofen (ADVIL,MOTRIN) 800 MG tablet Take 1 tablet (800 mg  total) by mouth every 8 (eight) hours as needed for moderate pain. 11/29/16   Galvan, Joanna GambleJody, MD  oxyCODONE (OXY IR/ROXICODONE) 5 MG immediate release tablet Take 1 tablet (5 mg total) by mouth every 6 (six) hours as needed for severe pain (pain scale 4-7). 11/29/16   Galvan, Joanna GambleJody, MD  Prenatal Vit-Fe Fumarate-FA (PRENATAL MULTIVITAMIN) TABS tablet Take 1 tablet by mouth daily at 12 noon. 11/29/16   Galvan, Joanna GambleJody, MD  VENTOLIN HFA 108 (90 Base) MCG/ACT inhaler Inhale 2 puffs into the lungs every 6 (six) hours as needed. Shortness of breath/ wheezing 09/01/15   [provider]    Allergies Azithromycin; Bupropion; Buspar [buspirone]; Cefprozil; and Latex   REVIEW OF SYSTEMS  Negative except as noted here or in the History of Present Illness.   PHYSICAL EXAMINATION  Initial Vital Signs Blood pressure (!) 154/89, pulse (!) 102, temperature 98.6 F (37 C), temperature source Oral, resp. rate 18, last menstrual period 07/13/2018, SpO2 98 %, unknown if currently breastfeeding.  Examination General: Well-developed, well-nourished female in no acute distress; appearance consistent with age of record HENT: normocephalic; atraumatic; left TM normal, right TM erythematous and bulging; nasal congestion Eyes: pupils equal, round and reactive to light; extraocular muscles intact Neck: supple Heart: regular rate and rhythm  Lungs: clear to auscultation bilaterally Abdomen: soft; nondistended; nontender; bowel sounds present Extremities: No deformity; full range of motion; pulses normal Neurologic: Awake, alert and oriented; motor function intact in all extremities and symmetric; no facial droop Skin: Warm and dry Psychiatric: Normal mood and affect   RESULTS  Summary of this visit's results, reviewed by myself:   EKG Interpretation  Date/Time:    Ventricular Rate:    PR Interval:    QRS Duration:   QT Interval:    QTC Calculation:   R Axis:     Text  Interpretation:        Laboratory Studies: No results found for this or any previous visit (from the past 24 hour(s)). Imaging Studies: No results found.  ED COURSE and MDM  Nursing notes and initial vitals signs, including pulse oximetry, reviewed.  Vitals:   09/02/18 0137  BP: (!) 154/89  Pulse: (!) 102  Resp: 18  Temp: 98.6 F (37 C)  TempSrc: Oral  SpO2: 98%   Will treat with clindamycin due to cephalosporin allergy which may cross-react with amoxicillin.  PROCEDURES    ED DIAGNOSES     ICD-10-CM   1. Non-recurrent acute suppurative otitis media of right ear without spontaneous rupture of tympanic membrane H66.001        Joanna Piacente, MD 09/02/18 0202

## 2021-03-11 ENCOUNTER — Ambulatory Visit
Admission: EM | Admit: 2021-03-11 | Discharge: 2021-03-11 | Disposition: A | Discharge status: Home / Self Care | Payer: 59 | Attending: Urgent Care | Admitting: Urgent Care

## 2021-03-11 ENCOUNTER — Encounter: Payer: Self-pay | Admitting: Emergency Medicine

## 2021-03-11 ENCOUNTER — Other Ambulatory Visit: Payer: Self-pay

## 2021-03-11 ENCOUNTER — Ambulatory Visit (INDEPENDENT_AMBULATORY_CARE_PROVIDER_SITE_OTHER): Payer: 59

## 2021-03-11 DIAGNOSIS — B9789 Other viral agents as the cause of diseases classified elsewhere: Secondary | ICD-10-CM | POA: Diagnosis not present

## 2021-03-11 DIAGNOSIS — J3089 Other allergic rhinitis: Secondary | ICD-10-CM

## 2021-03-11 DIAGNOSIS — R059 Cough, unspecified: Secondary | ICD-10-CM

## 2021-03-11 DIAGNOSIS — J988 Other specified respiratory disorders: Secondary | ICD-10-CM

## 2021-03-11 DIAGNOSIS — J4530 Mild persistent asthma, uncomplicated: Secondary | ICD-10-CM

## 2021-03-11 DIAGNOSIS — R0789 Other chest pain: Secondary | ICD-10-CM

## 2021-03-11 MED ORDER — CETIRIZINE HCL 10 MG PO TABS: 10.0000 mg | ORAL_TABLET | Freq: Every day | ORAL | 0 refills | Status: DC

## 2021-03-11 MED ORDER — PROMETHAZINE-DM 6.25-15 MG/5ML PO SYRP: 5.0000 mL | ORAL_SOLUTION | Freq: Every evening | ORAL | 0 refills | Status: DC | PRN

## 2021-03-11 MED ORDER — PREDNISONE 20 MG PO TABS: ORAL_TABLET | ORAL | 0 refills | Status: DC

## 2021-03-11 MED ORDER — BENZONATATE 100 MG PO CAPS: 100.0000 mg | ORAL_CAPSULE | Freq: Three times a day (TID) | ORAL | 0 refills | Status: DC | PRN

## 2021-03-11 NOTE — ED Provider Notes (Signed)
[facility]   MRN: [id number] DOB: 08-05-1991  Subjective:   Joanna Galvan is a 30 y.o. female presenting for 3 day history of acute onset cough, body aches, throat pain, sinus congestion. Has had chest pain, upper back pain. Has a history of allergic rhinitis, asthma. Has been using otc medications, her albuterol inhaler. Took 3 at home COVID tests and were all negative. Patient is vaccinated but no booster.   No current facility-administered medications for this encounter.  Current Outpatient Medications:    clindamycin (CLEOCIN) 300 MG capsule, Take 1 capsule (300 mg total) by mouth 4 (four) times daily. X 7 days (Patient not taking: Reported on 03/11/2021), Disp: 28 capsule, Rfl: 0   clonazePAM (KLONOPIN) 0.5 MG tablet, Take 0.25 mg by mouth daily as needed for anxiety. anxiety, Disp: , Rfl:    HYDROcodone-acetaminophen (NORCO) 5-325 MG tablet, Take 1 tablet by mouth every 4 (four) hours as needed (for pain). (Patient not taking: Reported on 03/11/2021), Disp: 6 tablet, Rfl: 0   ibuprofen (ADVIL,MOTRIN) 800 MG tablet, Take 1 tablet (800 mg total) by mouth every 8 (eight) hours as needed for moderate pain., Disp: 45 tablet, Rfl: 1   VENTOLIN HFA 108 (90 Base) MCG/ACT inhaler, Inhale 2 puffs into the lungs every 6 (six) hours as needed. Shortness of breath/ wheezing, Disp: , Rfl: 2   Allergies  Allergen Reactions   Azithromycin Hives   Bupropion Hives   Buspar [Buspirone]    Cefprozil Hives   Latex Itching and Swelling    Past Medical History:  Diagnosis Date   ADHD (attention deficit hyperactivity disorder)    Anxiety    Asthma    Depression    GERD (gastroesophageal reflux disease)    History of ulcerative colitis    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) [facility])      Past Surgical History:  Procedure Laterality Date   CESAREAN SECTION N/A 11/26/2016   Procedure: CESAREAN SECTION;  Surgeon: Huel Cote, MD;  Location: [facility] BIRTHING SUITES;  Service:  Obstetrics;  Laterality: N/A;  Odelia Gage, RNFA   TONSILLECTOMY      Family History  Problem Relation Age of Onset   Diabetes Mother    Diabetes Maternal Grandmother    Cancer Maternal Grandmother        breast    Social History   Tobacco Use   Smoking status: Former    Types: Cigarettes    Quit date: 12/14/2015    Years since quitting: 5.2   Smokeless tobacco: Never  Substance Use Topics   Alcohol use: Yes    Comment: ocasionally   Drug use: Yes    Types: Marijuana    Comment: last used 11/2015    ROS   Objective:   Vitals: BP 130/85 (BP Location: Left Arm)   Pulse 85   Temp 98 F (36.7 C) (Oral)   Resp 18   SpO2 97%   Physical Exam Constitutional:      General: She is not in acute distress.    Appearance: Normal appearance. She is well-developed. She is not ill-appearing, toxic-appearing or diaphoretic.  HENT:     Head: Normocephalic and atraumatic.     Nose: Nose normal.     Mouth/Throat:     Mouth: Mucous membranes are moist.  Eyes:     Extraocular Movements: Extraocular movements intact.     Pupils: Pupils are equal, round, and reactive to light.  Cardiovascular:     Rate and Rhythm: Normal rate  and regular rhythm.     Pulses: Normal pulses.     Heart sounds: Normal heart sounds. No murmur heard.   No friction rub. No gallop.  Pulmonary:     Effort: Pulmonary effort is normal. No respiratory distress.     Breath sounds: Normal breath sounds. No stridor. No wheezing, rhonchi or rales.  Skin:    General: Skin is warm and dry.     Findings: No rash.  Neurological:     Mental Status: She is alert and oriented to person, place, and time.  Psychiatric:        Mood and Affect: Mood normal.        Behavior: Behavior normal.        Thought Content: Thought content normal.    DG Chest 2 View  Result Date: 03/11/2021 CLINICAL DATA:  Cough.  Congestion and body aches. EXAM: CHEST - 2 VIEW COMPARISON:  September 01, 2015 FINDINGS: There is an 8 mm nodular  density projected over the left apex, not seen on the previous study. No other nodules. No masses. No focal infiltrates. The cardiomediastinal silhouette is normal. No pneumothorax. No other abnormalities. IMPRESSION: The 8 mm nodular density projected over the left apex may represent confluence of shadows but a true nodule is not excluded. Recommend CT imaging for better evaluation. No other abnormalities are identified. Electronically Signed   By: Gerome Sam III M.D   On: 03/11/2021 15:15     Assessment and Plan :   PDMP not reviewed this encounter.  1. Viral respiratory illness   2. Cough   3. Atypical chest pain   4. Allergic rhinitis due to other allergic trigger, unspecified seasonality   5. Mild persistent asthma without complication     In light of patient's symptoms, history of allergic rhinitis and has been recommended an oral prednisone course.  Continue using albuterol, recommended supportive care in addition to that.  COVID-19 testing pending.  Follow-up with PCP for recheck and consideration for repeat chest x-ray or chest CT scan.  Counseled patient on potential for adverse effects with medications prescribed/recommended today, ER and return-to-clinic precautions discussed, patient verbalized understanding.    Wallis Bamberg, [place] 03/11/21 [phone]

## 2021-03-11 NOTE — Discharge Instructions (Signed)
We will notify you of your COVID-19 test results as they arrive and may take between 24 to 48 hours.  I encourage you to sign up for MyChart if you have not already done so as this can be the easiest way for [place] to communicate results to you online or through a phone app.  In the meantime, if you develop worsening symptoms including fever, chest pain, shortness of breath despite our current treatment plan then please report to the emergency room as this may be a sign of worsening status from possible COVID-19 infection.  Otherwise, we will manage this as a viral syndrome. For sore throat or cough try using a honey-based tea. Use 3 teaspoons of honey with juice squeezed from half lemon. Place shaved pieces of ginger into 1/2-1 cup of water and warm over stove top. Then mix the ingredients and repeat every 4 hours as needed. Please take Tylenol 500mg -650mg  every 6 hours for aches and pains, fevers. Hydrate very well with at least 2 liters of water. Eat light meals such as soups to replenish electrolytes and soft fruits, veggies. Start an antihistamine like Zyrtec, Allegra or Claritin for postnasal drainage, sinus congestion.  You can take this together with pseudoephedrine (Sudafed) at a dose of 60 mg 2-3 times a day as needed for the same kind of congestion.     There was an abnormal spot on the x-ray that was not very clear.  I recommend following up with your regular doctor for consideration of a repeat chest x-ray or obtaining a chest CT scan.  In the meantime start the oral prednisone course to help with your allergic rhinitis and asthma.

## 2021-03-11 NOTE — ED Triage Notes (Signed)
Pt here for cough and congestion with body aches and nasal congestion x 3 days; pt sts multiple neg home covid tests

## 2021-03-12 LAB — NOVEL CORONAVIRUS, NAA: SARS-CoV-2, NAA: NOT DETECTED

## 2021-03-12 LAB — SARS-COV-2, NAA 2 DAY TAT

## 2021-03-19 ENCOUNTER — Other Ambulatory Visit: Payer: Self-pay | Admitting: Internal Medicine

## 2021-03-19 ENCOUNTER — Ambulatory Visit
Admission: RE | Admit: 2021-03-19 | Discharge: 2021-03-19 | Disposition: A | Discharge status: Home / Self Care | Payer: 59 | Source: Ambulatory Visit | Attending: Internal Medicine | Admitting: Internal Medicine

## 2021-03-19 ENCOUNTER — Other Ambulatory Visit: Payer: Self-pay

## 2021-03-19 DIAGNOSIS — R911 Solitary pulmonary nodule: Secondary | ICD-10-CM

## 2021-04-26 ENCOUNTER — Encounter: Payer: Self-pay | Admitting: Physician Assistant

## 2021-04-26 ENCOUNTER — Ambulatory Visit: Payer: 59 | Admitting: Physician Assistant

## 2021-04-26 ENCOUNTER — Other Ambulatory Visit: Payer: Self-pay

## 2021-04-26 VITALS — BP 111/77 | HR 73 | Temp 97.6°F | Ht 63.0 in | Wt 185.0 lb

## 2021-04-26 DIAGNOSIS — F419 Anxiety disorder, unspecified: Secondary | ICD-10-CM

## 2021-04-26 DIAGNOSIS — J069 Acute upper respiratory infection, unspecified: Secondary | ICD-10-CM

## 2021-04-26 DIAGNOSIS — F902 Attention-deficit hyperactivity disorder, combined type: Secondary | ICD-10-CM | POA: Diagnosis not present

## 2021-04-26 DIAGNOSIS — F320 Major depressive disorder, single episode, mild: Secondary | ICD-10-CM

## 2021-04-26 DIAGNOSIS — F603 Borderline personality disorder: Secondary | ICD-10-CM

## 2021-04-26 NOTE — Progress Notes (Signed)
Joanna Galvan is a 30 y.o. female here for a new problem.  History of Present Illness:   Chief Complaint  Patient presents with   Establish Care    No acute concerns.     HPI  Joanna Galvan is a 30 y/o female who presents to clinic today to establish care.  Social: Patient is a Physiological scientist. She lives alone with her son who is currently in [place]. Local support system with her mother- but she does not have a great relationship with her mother.  Acute Concerns: Follow-up for Viral Respiratory Illness: Patient was seen in [facility] on 03/11/21 and she presented with having a 3-day history of acute onset cough, body aches, throat pain, and sinus congestion. Confirmed having chest pain and back pain. She had been using her OTC medications and albuterol inhaler. Also took 3 at home COVID-19 tests and all were negative - she is vaccinated but no booster.  X-ray of Chest IMPRESSION: The 8 mm nodular density projected over the left apex may represent confluence of shadows but a true nodule is not excluded. Recommend CT imaging for better evaluation - she did have this recently and was results were normal. No other abnormalities are identified.  She was recommend an oral course of Prednisone 20 mg daily and to continue using her albuterol - was also recommended supportive care in addition to albuterol use.   Today patient reports that symptoms have resolved.  Chronic Issues: Depression/Anxiety/ADHD: Patient was diagnosed with these around 6th grade. She has not taken them consistently due to making her feel numb and "zombie like." She can not remember what medications she took in the past  - she does remember having to take two of them together. She was currently taking Viibryd and Rexulti - last took them in December 2021. She did take counseling/psychiatry in the past but has not been back currently - she would like to get re-established with a different facility. No history of suicidal  attempts.  She was previously on Adderall in the past for her ADHD but stopped due to weight gain and later opted for Vyvanse. Provider at the time did do blood work - checked her thyroid and results cam back normal.  BPD: diagnosed a couple of years ago.  Past Medical History:  Diagnosis Date   ADHD (attention deficit hyperactivity disorder)    Anxiety    Asthma    well controlled; bronchitis easily   Depression    GERD (gastroesophageal reflux disease)    H pylori history   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) (HCC)      Social History   Tobacco Use   Smoking status: Former    Packs/day: 0.50    Types: Cigarettes    Start date: 08/19/2008    Quit date: 12/14/2015    Years since quitting: 5.3   Smokeless tobacco: Never  Vaping Use   Vaping Use: Every day  Substance Use Topics   Alcohol use: Yes    Comment: ocasionally   Drug use: Not Currently    Types: Marijuana    Comment: last used 11/2015    Past Surgical History:  Procedure Laterality Date   CESAREAN SECTION N/A 11/26/2016   Procedure: CESAREAN SECTION;  Surgeon: Huel Cote, MD;  Location: [facility] BIRTHING SUITES;  Service: Obstetrics;  Laterality: N/A;  Odelia Gage, RNFA   TONSILLECTOMY      Family History  Problem Relation Age of Onset   Diabetes Mother  Irritable bowel syndrome Mother    Anxiety disorder Father    ADD / ADHD Father    Depression Sister    Irritable bowel syndrome Sister    COPD Maternal Grandmother    Diabetes Maternal Grandmother    Cancer Maternal Grandmother        breast   COPD Paternal Grandmother    Lung cancer Paternal Grandmother     Allergies  Allergen Reactions   Azithromycin Hives   Bupropion Hives   Buspar [Buspirone]    Cefprozil Hives   Latex Itching and Swelling    Current Medications:  No current outpatient medications on file.   Review of Systems:   ROS  Vitals:   Vitals:   04/26/21 0845  BP: 111/77  Pulse: 73  Temp: 97.6 F (36.4 C)   TempSrc: Temporal  SpO2: 98%  Weight: 185 lb (83.9 kg)  Height: 5\' 3"  (1.6 m)     Body mass index is 32.77 kg/m.  Physical Exam:   Physical Exam Vitals and nursing note reviewed.  Constitutional:      General: She is not in acute distress.    Appearance: She is well-developed. She is not ill-appearing or toxic-appearing.  Cardiovascular:     Rate and Rhythm: Normal rate and regular rhythm.     Pulses: Normal pulses.     Heart sounds: Normal heart sounds, S1 normal and S2 normal.     Comments: No LE edema Pulmonary:     Effort: Pulmonary effort is normal.     Breath sounds: Normal breath sounds.  Skin:    General: Skin is warm and dry.  Neurological:     Mental Status: She is alert.     GCS: GCS eye subscore is 4. GCS verbal subscore is 5. GCS motor subscore is 6.  Psychiatric:        Speech: Speech normal.        Behavior: Behavior normal. Behavior is cooperative.    Assessment and Plan:   1. Depression, major, single episode, mild (HCC) Denies SI/HI Referral for psychiatry for further management  2. Anxiety Denies SI/HI Referral for psychiatry for further management  3. Attention deficit hyperactivity disorder (ADHD), combined type Denies SI/HI Referral for psychiatry for further management  4. Borderline personality disorder [facility]) Denies SI/HI Referral for psychiatry for further management  5. Recent upper respiratory tract infection Lungs clear Urgent care note and imaging reviewed with patient  [facility], PA-C

## 2021-04-26 NOTE — Patient Instructions (Addendum)
It was great to see you!  We will put in a referral for psychiatry, however I would like for you try to reach out to connect with Dr. Rene Kocher -- he's fantastic.  Please go through [facility], [facility],  or [facility] in order to get set up with Dr. Angus Palms. He works here in [place] but is only doing appointments right now through these avenues.  The easiest way is to get this set-up is either: --call 1-800-[facility] and they will guide you to set up their app --register online to setup at [email or web address] with Dr. Angus Palms  --  Please try to forward lab results from your psychiatrist for me.  Take care,  Jarold Motto PA-C

## 2021-05-07 ENCOUNTER — Encounter: Payer: Self-pay | Admitting: Family Medicine

## 2021-05-07 ENCOUNTER — Telehealth (INDEPENDENT_AMBULATORY_CARE_PROVIDER_SITE_OTHER): Payer: 59 | Admitting: Family Medicine

## 2021-05-07 DIAGNOSIS — Z20822 Contact with and (suspected) exposure to covid-19: Secondary | ICD-10-CM

## 2021-05-07 DIAGNOSIS — J4521 Mild intermittent asthma with (acute) exacerbation: Secondary | ICD-10-CM | POA: Diagnosis not present

## 2021-05-07 MED ORDER — PREDNISONE 20 MG PO TABS: ORAL_TABLET | ORAL | 0 refills | Status: DC

## 2021-05-07 MED ORDER — GUAIFENESIN-CODEINE 100-10 MG/5ML PO SOLN: 5.0000 mL | Freq: Four times a day (QID) | ORAL | 0 refills | Status: DC | PRN

## 2021-05-07 NOTE — Progress Notes (Signed)
Virtual Visit via Video Note  Subjective  CC:  Chief Complaint  Patient presents with   Emesis    Symptoms since 05/05/2021. 2 negative at home  test   Fever    100.3   Headache    Body aches, cough     I connected with Estevan Oaks on 05/07/21 at  2:00 PM EDT by a video enabled telemedicine application and verified that I am speaking with the correct person using two identifiers. Location patient: Home Location provider: [facility] [address], Office Persons participating in the virtual visit: MURNA BACKER, Willow Ora, MD Jolyne Loa CMA  I discussed the limitations of evaluation and management by telemedicine and the availability of in person appointments. The patient expressed understanding and agreed to proceed. HPI: Joanna Galvan is a 30 y.o. female who was contacted today to address the problems listed above in the chief complaint. Flu like sxs x 2 days as listed above. Tmax 103.2 two day ago. Hasn't been that high since. Responds to antipyretics.  congested with cough, n and vomiting x 2, mostly posttussive and headaches. 2 at home tests were negative, however both mom and dad have covid infection now. She has asthma but hasn't needed inhaler often. No sob or doe.   Assessment  1. Suspected COVID-19 virus infection   2. Mild intermittent asthma with exacerbation      Plan  Suspect covid infection given sxs and exposures: treat supportively with pred, cough meds and rest/fluids. She has an inhaler. We discussed risks/benefits and indications for antivirals. We elect to defer for now. If worsensing in next 48 hours, we can reconsider. She currently is improving.   I discussed the assessment and treatment plan with the patient. The patient was provided an opportunity to ask questions and all were answered. The patient agreed with the plan and demonstrated an understanding of the instructions.   The patient was advised to call back or seek an  in-person evaluation if the symptoms worsen or if the condition fails to improve as anticipated. Follow up: prn  Visit date not found  Meds ordered this encounter  Medications   predniSONE (DELTASONE) 20 MG tablet    Sig: Take 3 tabs daily for 5 days    Dispense:  15 tablet    Refill:  0   guaiFENesin-codeine 100-10 MG/5ML syrup    Sig: Take 5 mLs by mouth every 6 (six) hours as needed for cough.    Dispense:  120 mL    Refill:  0       I reviewed the patients updated PMH, FH, and SocHx.    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   predniSONE (DELTASONE) 20 MG tablet Take 3 tabs daily for 5 days    Allergies: Patient is allergic to azithromycin, bupropion, buspar [buspirone], cefprozil, and latex. Family History: Patient family history includes  ADD / ADHD in her father; Anxiety disorder in her father; COPD in her maternal grandmother and paternal grandmother; Cancer in her maternal grandmother; Depression in her sister; Diabetes in her maternal grandmother and mother; Irritable bowel syndrome in her mother and sister; Lung cancer in her paternal grandmother. Social History:  Patient  reports that she quit smoking about 5 years ago. Her smoking use included cigarettes. She started smoking about 12 years ago. She smoked an average of .5 packs per day. She has never used  smokeless tobacco. She reports current alcohol use. She reports that she does not currently use drugs after having used the following drugs: Marijuana.  Review of Systems+ for fever malaise or anorexia Cardiovascular: negative for chest pain Respiratory: negative for SOB or persistent cough Gastrointestinal: negative for abdominal pain  OBJECTIVE Vitals: There were no vitals taken for this visit. General: no acute distress , A&Ox3 No respiratory distress  Willow Ora, MD

## 2021-07-27 ENCOUNTER — Other Ambulatory Visit: Payer: Self-pay

## 2021-07-27 ENCOUNTER — Emergency Department (HOSPITAL_BASED_OUTPATIENT_CLINIC_OR_DEPARTMENT_OTHER)
Admission: EM | Admit: 2021-07-27 | Discharge: 2021-07-27 | Disposition: A | Discharge status: Home / Self Care | Payer: 59 | Attending: Emergency Medicine | Admitting: Emergency Medicine

## 2021-07-27 ENCOUNTER — Emergency Department (HOSPITAL_BASED_OUTPATIENT_CLINIC_OR_DEPARTMENT_OTHER): Payer: 59 | Admitting: Radiology

## 2021-07-27 ENCOUNTER — Encounter (HOSPITAL_BASED_OUTPATIENT_CLINIC_OR_DEPARTMENT_OTHER): Payer: Self-pay | Admitting: Emergency Medicine

## 2021-07-27 DIAGNOSIS — S83004A Unspecified dislocation of right patella, initial encounter: Secondary | ICD-10-CM | POA: Diagnosis not present

## 2021-07-27 DIAGNOSIS — Z87891 Personal history of nicotine dependence: Secondary | ICD-10-CM | POA: Diagnosis not present

## 2021-07-27 DIAGNOSIS — M2391 Unspecified internal derangement of right knee: Secondary | ICD-10-CM | POA: Insufficient documentation

## 2021-07-27 DIAGNOSIS — W228XXA Striking against or struck by other objects, initial encounter: Secondary | ICD-10-CM | POA: Insufficient documentation

## 2021-07-27 DIAGNOSIS — J4540 Moderate persistent asthma, uncomplicated: Secondary | ICD-10-CM | POA: Diagnosis not present

## 2021-07-27 DIAGNOSIS — S8991XA Unspecified injury of right lower leg, initial encounter: Secondary | ICD-10-CM | POA: Diagnosis present

## 2021-07-27 MED ORDER — HYDROCODONE-ACETAMINOPHEN 5-325 MG PO TABS: 1.0000 | ORAL_TABLET | Freq: Four times a day (QID) | ORAL | 0 refills | Status: DC | PRN
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
## 2021-07-27 NOTE — ED Triage Notes (Signed)
Pt arrives pov with c/o right knee injury last night, swelling noted. Pt endorses weakness, inability to bear weight

## 2021-07-27 NOTE — Discharge Instructions (Signed)
You likely have torn something in your knee also.  There is too much pain and swelling right now to determine it.  Follow-up with orthopedic surgery for further work-up

## 2021-07-31 ENCOUNTER — Other Ambulatory Visit: Payer: Self-pay

## 2021-07-31 ENCOUNTER — Ambulatory Visit (HOSPITAL_BASED_OUTPATIENT_CLINIC_OR_DEPARTMENT_OTHER): Payer: 59 | Admitting: Orthopaedic Surgery

## 2021-07-31 DIAGNOSIS — M25561 Pain in right knee: Secondary | ICD-10-CM | POA: Diagnosis not present

## 2021-07-31 DIAGNOSIS — S83004A Unspecified dislocation of right patella, initial encounter: Secondary | ICD-10-CM
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
## 2021-08-02 ENCOUNTER — Encounter (HOSPITAL_BASED_OUTPATIENT_CLINIC_OR_DEPARTMENT_OTHER): Payer: Self-pay | Admitting: Orthopaedic Surgery

## 2021-08-03 ENCOUNTER — Other Ambulatory Visit (HOSPITAL_BASED_OUTPATIENT_CLINIC_OR_DEPARTMENT_OTHER): Payer: Self-pay

## 2021-08-03 ENCOUNTER — Other Ambulatory Visit (HOSPITAL_BASED_OUTPATIENT_CLINIC_OR_DEPARTMENT_OTHER): Payer: Self-pay | Admitting: Orthopaedic Surgery

## 2021-08-03 MED ORDER — LORAZEPAM 1 MG PO TABS
1.0000 mg | ORAL_TABLET | Freq: Three times a day (TID) | ORAL | 0 refills | Status: DC
  Filled 2021-08-03: qty 1, 1d supply, fill #0

## 2021-08-04 ENCOUNTER — Other Ambulatory Visit: Payer: 59

## 2021-08-07 ENCOUNTER — Telehealth: Payer: Self-pay | Admitting: Orthopaedic Surgery

## 2021-08-07 NOTE — Telephone Encounter (Signed)
Pt calling to get an mri appt resch'd. Dr. Steward Drone only has openings after 08/23/20 and pt states they leave out of town that day and will not return for a bit. Pt asking if he had any other openings she can get into prior to leaving that day. The best call back number is [phone].

## 2021-08-09 ENCOUNTER — Ambulatory Visit (HOSPITAL_BASED_OUTPATIENT_CLINIC_OR_DEPARTMENT_OTHER): Payer: 59 | Admitting: Orthopaedic Surgery

## 2021-08-09 ENCOUNTER — Encounter (HOSPITAL_BASED_OUTPATIENT_CLINIC_OR_DEPARTMENT_OTHER): Payer: Self-pay | Admitting: Orthopaedic Surgery

## 2021-08-14 ENCOUNTER — Telehealth: Payer: Self-pay | Admitting: Orthopaedic Surgery

## 2021-08-14 NOTE — Telephone Encounter (Signed)
[employer] forms received (via [employer] To [employer].

## 2021-08-16 ENCOUNTER — Other Ambulatory Visit: Payer: Self-pay

## 2021-08-16 ENCOUNTER — Ambulatory Visit
Admission: RE | Admit: 2021-08-16 | Discharge: 2021-08-16 | Disposition: A | Discharge status: Home / Self Care | Payer: 59 | Source: Ambulatory Visit | Attending: Orthopaedic Surgery | Admitting: Orthopaedic Surgery

## 2021-08-16 DIAGNOSIS — S83004A Unspecified dislocation of right patella, initial encounter: Secondary | ICD-10-CM

## 2021-08-23 ENCOUNTER — Other Ambulatory Visit: Payer: Self-pay

## 2021-08-23 ENCOUNTER — Ambulatory Visit (INDEPENDENT_AMBULATORY_CARE_PROVIDER_SITE_OTHER): Payer: 59 | Admitting: Orthopaedic Surgery

## 2021-08-23 DIAGNOSIS — S83004A Unspecified dislocation of right patella, initial encounter: Secondary | ICD-10-CM | POA: Diagnosis not present
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
## 2021-08-27 ENCOUNTER — Telehealth: Payer: Self-pay | Admitting: Orthopaedic Surgery

## 2021-08-27 NOTE — Telephone Encounter (Signed)
Pt submitted medical release form and $25.00 cash payment to [employer]. Pt states she spoke with [employer] Rep and that received short term forms by fax. Accepted 08/27/21

## 2021-08-30 ENCOUNTER — Ambulatory Visit (HOSPITAL_BASED_OUTPATIENT_CLINIC_OR_DEPARTMENT_OTHER): Payer: 59 | Admitting: Orthopaedic Surgery

## 2021-09-14 ENCOUNTER — Encounter (HOSPITAL_BASED_OUTPATIENT_CLINIC_OR_DEPARTMENT_OTHER): Payer: Self-pay | Admitting: Orthopaedic Surgery

## 2021-09-18 ENCOUNTER — Ambulatory Visit (HOSPITAL_BASED_OUTPATIENT_CLINIC_OR_DEPARTMENT_OTHER): Payer: 59 | Attending: Orthopaedic Surgery | Admitting: Physical Therapy

## 2021-09-18 ENCOUNTER — Encounter (HOSPITAL_BASED_OUTPATIENT_CLINIC_OR_DEPARTMENT_OTHER): Payer: Self-pay | Admitting: Physical Therapy

## 2021-09-18 ENCOUNTER — Other Ambulatory Visit: Payer: Self-pay

## 2021-09-18 DIAGNOSIS — M25661 Stiffness of right knee, not elsewhere classified: Secondary | ICD-10-CM | POA: Diagnosis present

## 2021-09-18 DIAGNOSIS — R262 Difficulty in walking, not elsewhere classified: Secondary | ICD-10-CM | POA: Insufficient documentation

## 2021-09-18 DIAGNOSIS — S83004A Unspecified dislocation of right patella, initial encounter: Secondary | ICD-10-CM | POA: Diagnosis not present

## 2021-09-18 DIAGNOSIS — M6281 Muscle weakness (generalized): Secondary | ICD-10-CM | POA: Diagnosis present

## 2021-09-18 DIAGNOSIS — M25561 Pain in right knee: Secondary | ICD-10-CM | POA: Insufficient documentation

## 2021-09-18 NOTE — Therapy (Signed)
OUTPATIENT PHYSICAL THERAPY LOWER EXTREMITY EVALUATION   Patient Name: Joanna Galvan MRN: [id number] DOB:10/08/1990, 31 y.o., female Today's Date: 09/19/2021   PT End of Session - 09/18/21 1201     Visit Number 1    Number of Visits 16    Date for PT Re-Evaluation 11/13/21    PT Start Time 1158    PT Stop Time 1243    PT Time Calculation (min) 45 min    Activity Tolerance Patient tolerated treatment well    Behavior During Therapy WFL for tasks assessed/performed             Past Medical History:  Diagnosis Date   ADHD (attention deficit hyperactivity disorder)    Anxiety    Asthma    well controlled; bronchitis easily   Depression    GERD (gastroesophageal reflux disease)    H pylori history   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) [facility])    Past Surgical History:  Procedure Laterality Date   CESAREAN SECTION N/A 11/26/2016   Procedure: CESAREAN SECTION;  Surgeon: Paula Compton, MD;  Location: [place];  Service: Obstetrics;  Laterality: N/A;  Jill Side, RNFA   TONSILLECTOMY     Patient Active Problem List   Diagnosis Date Noted   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   Breech presentation delivered 11/26/2016   IUGR (intrauterine growth restriction) affecting care of mother, third trimester, fetus 1 [id number]   Single umbilical artery affecting management of mother in singleton pregnancy, antepartum 11/26/2016   S/P primary low transverse C-section 11/26/2016   Moderate persistent asthma without complication [id number]   Obesity (BMI 30-39.9) 01/04/2015   GAD (generalized anxiety disorder) 01/04/2015    PCP: Inda Coke, PA  REFERRING PROVIDER: Vanetta Mulders, MD  REFERRING DIAG: [id number] (ICD-10-CM) - Patellar dislocation, right, initial encounter   THERAPY DIAG:  Right knee pain, unspecified chronicity  Stiffness of right knee, not elsewhere classified  Muscle weakness (generalized)  Difficulty in walking, not elsewhere  classified  ONSET DATE: 07/26/2021  SUBJECTIVE:   SUBJECTIVE STATEMENT: Pt was at a concert on [id number] and someone jumped and landed onto her right knee.  She had a patella dislocation and concussion.  Pt reduced her patella herself.  She has no prior history of patellar dislocations. Pt went to ED on 07/27/2021 and had x rays which indicated moderate joint effusion and No acute bony abnormality.  Pt was given crutches and a KI and referred to ortho MD.   Pt saw orhto MD who placed her in a J brace for the patella and ordered a MRI.  Pt had a MRI and follow up with MD.  He ordered PT and note indicated to continue to wear her J brace and undergo physical therapy for strengthening and range of motion about the knee.  Pt has increased swelling if she stands awhile such as an hour.  Pt has been using the J brace with ambulation and mobility.  Pt states her knee feels weak and she has instability ambulating without brace.  Pt states her knee feels like it wants to bend the wrong way when ambulating out of the brace.  Pt has increased pain with increased standing duration.  Pt is limited with bending and unable to squat.  Pt unable to perform her normal work activities. Pt is limited with taking care of and playing with her son.       PERTINENT HISTORY: GAD (generalized anxiety disorder), depression  PAIN:  Are you having pain? Yes NPRS  scale: 3/10 current, 7-8/10 worst, /10 best Pain location: R knee which is worse in posterior and medial knee   PRECAUTIONS: Other: per dx  WEIGHT BEARING RESTRICTIONS No  FALLS:  Has patient fallen in last 6 months? No  LIVING ENVIRONMENT: Lives with: son lives with her Lives in: 1 story home Stairs: No;  Has following equipment at home: Crutches  OCCUPATION: Pt is a Environmental consultant. 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  PLOF: Independent; Pt able to perform her normal daily activities, functional mobility, and work activities without pain or  limitation.  Pt was able to take care of her son without difficulty.   PATIENT GOALS improve mobility, being able to play with and take care of her 16 year old son, return to PLOF, be able to hike, regain full ROM.   OBJECTIVE:   DIAGNOSTIC FINDINGS:  X ray: R knee  FINDINGS: Moderate joint effusion. No acute bony abnormality. Specifically, no fracture, subluxation, or dislocation. Soft tissues are intact.  IMPRESSION: Moderate joint effusion.  No acute bony abnormality.  MRI: IMPRESSION: 1. Typical osseous findings of transient patellar dislocation injury with bone contusions of the lateral femoral condyle anteriorly in the inferior medial patella. No cortical fracture or focal chondral defect identified. Associated partial tearing at the femoral attachment of the medial patellofemoral ligament. 2. Moderate size knee joint effusion. 3. The menisci, cruciate and collateral ligaments are intact.   PATIENT SURVEYS:  FOTO 38 with a goal of 53  COGNITION:  Overall cognitive status: Within functional limits for tasks assessed    OBSERVATION: Pt wearing J brace.  Swelling in R knee  GIRTH MEASUREMENT: Mid patella R:  39.5 cm, L 38.5   PALPATION: TTP:  medial and posterior knee  LE AROM/PROM:  A/PROM Right 09/19/2021 Left 09/19/2021  Hip flexion    Hip extension    Hip abduction    Hip adduction    Hip internal rotation    Hip external rotation    Knee flexion 103/112 deg 142  Knee extension Lacking 9/4 deg 10 deg of hyperext  Ankle dorsiflexion    Ankle plantarflexion    Ankle inversion    Ankle eversion     (Blank rows = not tested)  LE MMT:  MMT Right 09/19/2021 Left 09/19/2021  Hip flexion  5/5  Hip extension    Hip abduction  5/5  Hip adduction    Hip internal rotation    Hip external rotation    Knee flexion  5/5 tested in sitting  Knee extension  5/5  Ankle dorsiflexion    Ankle plantarflexion    Ankle inversion    Ankle eversion     (Blank rows =  not tested)    GAIT: Assistive device utilized:  Comments: Pt ambulates with J brace and has a moderate limp.  She is limited with heel strike and toe off.  Pt is limited with TKE.      TODAY'S TREATMENT: Pt performed quad sets with 5" hold x 10 reps, supine static extension stretch with heel propped x 2 mins, hooklying hip add isometric with 5" hold x 10 reps.    Pt received a HEP handout and was educated in correct form and appropriate frequency.  PATIENT EDUCATION:  Education details: Dx, objective findings, POC, and rationale for exercises.  Pt received a HEP handout and was educated in correct form and appropriate frequency.  Pt instructed she should not have increased pain with HEP. Person  educated: Patient Education method: Explanation, Demonstration, Tactile cues, Verbal cues, and Handouts Education comprehension: verbalized understanding, returned demonstration, verbal cues required, tactile cues required, and needs further education   HOME EXERCISE PROGRAM: Access Code: [facility] URL: [email or web address] Date: 09/18/2021 Prepared by: Ronny Flurry  Exercises Supine Quadricep Sets - 2 x daily - 7 x weekly - 2 sets - 10 reps - 5 seconds hold Supine Knee Extension Stretch on Towel Roll - 3 x daily - 7 x weekly - 2 reps - 1-2 mins hold Supine Hip Adduction Isometric with Ball - 1-2 x daily - 7 x weekly - 1-2 sets - 10 reps - 5 sec hold   ASSESSMENT:  CLINICAL IMPRESSION: Patient is a 31 y.o. female 7 weeks and 6 days s/p R patellar dislocation presenting to the clinic with R knee pain, limited ROM in R knee, muscle weakness in R LE and difficulty in walking.  Pt has been using the J brace with ambulation and mobility.  Pt has pain with and is limited with performing functional mobility skills including ambulation and stairs.  She has increased pain with increased standing duration.  Pt is limited with bending and unable to squat.  Pt is unable to perform her  normal work activities and is limited with taking care of and playing with her son.  Pt should benefit from skilled PT to address above impairments and improve overall function.    Objective impairments include Abnormal gait, decreased activity tolerance, decreased endurance, decreased mobility, difficulty walking, decreased ROM, decreased strength, hypomobility, increased edema, impaired flexibility, and pain. These impairments are limiting patient from cleaning, community activity, driving, meal prep, occupation, laundry, shopping, and ambulation .   REHAB POTENTIAL: Good  CLINICAL DECISION MAKING: Stable/uncomplicated  EVALUATION COMPLEXITY: Low   GOALS:   SHORT TERM GOALS:  STG Name Target Date Goal status  1 Pt will be independent and compliant with HEP for improved pain, ROM, strength, and function.  Baseline:  10/09/2021 INITIAL  2 Pt will demo improved R knee extension AROM to 0 deg for improved stiffness, mobility, and gait. Baseline:  10/09/2021 INITIAL  3 Pt will demo improved quality of gait having reduced limp and improved heel strike and toe off Baseline: 10/16/2021 INITIAL  4 Pt will be able to ambulate community distance without increased swelling or significant pain. Baseline: 10/23/2021 INITIAL  5 Pt will be able to perform a 6 inch step up with good control and form.  Baseline: 10/16/2021 INITIAL             LONG TERM GOALS:   LTG Name Target Date Goal status  1 Pt will deny any knee instability with her normal functional mobility skills.  Baseline: 11/13/2021 INITIAL  2 Pt will be able to perform her occupational activities without significant pain or limitation.  Baseline: 11/13/2021 INITIAL  3 Pt will be able to ambulate extended community distance without significant pain or difficulty.  Baseline: 11/13/2021 INITIAL  4 Pt will demo 5/5 R hip strength and at least 4+/5 R knee strength for improved tolerance to functional mobility and work activities.  Baseline:  11/13/2021 INITIAL  5 Pt will be able to squat to 60 deg with symmetrical Wb'ing and good form without significant pain for improved fxnl strength and performance of household chores.  Baseline: 11/13/2021 INITIAL  6 Pt will demo a reciprocal gait with stairs with good control. Baseline: 11/13/2021 INITIAL  7 Pt will ambulate with a normalized heel to toe gait without limping 11/13/2021  INITIAL   PLAN: PT FREQUENCY: 2x/week  PT DURATION: 8 weeks  PLANNED INTERVENTIONS: Therapeutic exercises, Therapeutic activity, Neuro Muscular re-education, Balance training, Gait training, Patient/Family education, Joint mobilization, Stair training, DME instructions, Aquatic Therapy, Dry Needling, Electrical stimulation, Cryotherapy, Moist heat, Taping, Ultrasound, and Manual therapy  PLAN FOR NEXT SESSION: Review and perform HEP.  Progress quad and hip strength per pt tolerance and dx.  Gait training   Selinda Michaels III PT, DPT 09/19/21 5:24 PM

## 2021-09-19 ENCOUNTER — Encounter (HOSPITAL_BASED_OUTPATIENT_CLINIC_OR_DEPARTMENT_OTHER): Payer: Self-pay | Admitting: Orthopaedic Surgery

## 2021-09-19 NOTE — Telephone Encounter (Signed)
Attempted to call patient multiple times and phone went straight to VM

## 2021-09-20 ENCOUNTER — Ambulatory Visit (HOSPITAL_BASED_OUTPATIENT_CLINIC_OR_DEPARTMENT_OTHER): Payer: 59 | Admitting: Orthopaedic Surgery

## 2021-09-20 ENCOUNTER — Other Ambulatory Visit: Payer: Self-pay

## 2021-09-20 DIAGNOSIS — S83004A Unspecified dislocation of right patella, initial encounter: Secondary | ICD-10-CM | POA: Diagnosis not present

## 2021-09-20 NOTE — Progress Notes (Signed)
Chief Complaint: right knee pain     History of Present Illness:   09/20/2021: Presents today for follow-up status post patella dislocation.  She does state that she will occasionally feel feelings of instability in the knee although this never occurs when she is in the brace.  Joanna Galvan is a 31 y.o. female presents today after patellar dislocation on 9 December.  She was at a metal concert and had someone land on her.  This subsequently resulted in a concussion as well as patella dislocation.  He states that she observe the knee on the lateral aspect of the leg.  She pushed this back into place.  She subsequently went to the emergency room and was given a knee immobilizer and crutches.  She still has swelling and pain about the knee.  There is some difficulty with weightbearing.  She has no prior history of patellar dislocations.  No history of ligamentous laxity.  She is here today with her mother.  She works as a Freight forwarder at a bar.    Surgical History:   None  PMH/PSH/Family History/Social History/Meds/Allergies:    Past Medical History:  Diagnosis Date   ADHD (attention deficit hyperactivity disorder)    Anxiety    Asthma    well controlled; bronchitis easily   Depression    GERD (gastroesophageal reflux disease)    H pylori history   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) [facility])    Past Surgical History:  Procedure Laterality Date   CESAREAN SECTION N/A 11/26/2016   Procedure: CESAREAN SECTION;  Surgeon: Paula Compton, MD;  Location: [place];  Service: Obstetrics;  Laterality: N/A;  Joanna Galvan, RNFA   TONSILLECTOMY     Social History   Socioeconomic History   Marital status: Single    Spouse name: Not on file   Number of children: Not on file   Years of education: Not on file   Highest education level: Not on file  Occupational History   Not on file  Tobacco Use   Smoking status: Former    Packs/day: 0.50     Types: Cigarettes    Start date: 08/19/2008    Quit date: 12/14/2015    Years since quitting: 5.7   Smokeless tobacco: Never  Vaping Use   Vaping Use: Every day  Substance and Sexual Activity   Alcohol use: Yes    Comment: ocasionally   Drug use: Not Currently    Types: Marijuana    Comment: last used 11/2015   Sexual activity: Yes    Birth control/protection: I.U.D., Condom  Other Topics Concern   Not on file  Social History Narrative   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    Social Determinants of Health   Financial Resource Strain: Not on file  Food Insecurity: Not on file  Transportation Needs: Not on file  Physical Activity: Not on file  Stress: Not on file  Social Connections: Not on file   Family History  Problem Relation Age of Onset   Diabetes Mother    Irritable bowel syndrome Mother    Anxiety disorder Father    ADD / ADHD Father    Depression Sister    Irritable bowel syndrome Sister    COPD Maternal Grandmother    Diabetes Maternal Grandmother  Cancer Maternal Grandmother        breast   COPD Paternal Grandmother    Lung cancer Paternal Grandmother    Allergies  Allergen Reactions   Azithromycin Hives   Bupropion Hives   Buspar [Buspirone]    Cefprozil Hives   Latex Itching and Swelling   Current Outpatient Medications  Medication Sig Dispense Refill   LORazepam (ATIVAN) 1 MG tablet Take 1 tablet (1 mg total) by mouth every 8 (eight) hours. (Patient not taking: Reported on 09/18/2021) 1 tablet 0   guaiFENesin-codeine 100-10 MG/5ML syrup Take 5 mLs by mouth every 6 (six) hours as needed for cough. (Patient not taking: Reported on 09/18/2021) 120 mL 0   HYDROcodone-acetaminophen (NORCO/VICODIN) 5-325 MG tablet Take 1-2 tablets by mouth every 6 (six) hours as needed. (Patient not taking: Reported on 09/18/2021) 6 tablet 0   predniSONE (DELTASONE) 20 MG tablet Take 3 tabs daily for 5 days (Patient not taking: Reported on 09/18/2021) 15 tablet 0   No  current facility-administered medications for this visit.   No results found.  Review of Systems:   A ROS was performed including pertinent positives and negatives as documented in the HPI.  Physical Exam :   Constitutional: NAD and appears stated age Neurological: Alert and oriented Psych: Appropriate affect and cooperative unknown if currently breastfeeding.   Comprehensive Musculoskeletal Exam:      Musculoskeletal Exam  Gait Normal  Alignment Normal   Right Left  Inspection Normal Normal  Palpation    Tenderness Diffuse None  Crepitus None None  Effusion Moderate None  Range of Motion    Extension 0 0  Flexion 30 90  Strength    Extension 5/5 5/5  Flexion 5/5 5/5  Ligament Exam     Generalized Laxity No No  Lachman Negative Negative   Pivot Shift Negative Negative  Anterior Drawer Negative Negative  Valgus at 0 Negative Negative  Valgus at 20 Negative Negative  Varus at 0 0 0  Varus at 20   0 0  Posterior Drawer at 90 0 0  Vascular/Lymphatic Exam    Edema None None  Venous Stasis Changes No No  Distal Circulation Normal Normal  Neurologic    Light Touch Sensation Intact Intact  Special Tests: Positive patellar apprehension     Imaging:   Xray (4 views right knee): Normal  MRI right knee: Torn MPFL with bony bruise pattern consistent with lateral patellar dislocation.  No evidence of chondral injury. I personally reviewed and interpreted the radiographs.   Assessment:   31 year old female with right lateral patellar dislocation after she was hit at a concert.  We did discuss the possibility recurrent instability.  At this time she does have subjective feelings of instability although these are rare and does not occur in the brace.  I described that I would like her to undergo significant physical therapy over the next several weeks in order to strengthen the quadriceps muscle.  Ideally she can have a stable knee without specific bracing.  We did discuss  there may be a role for MPFL reconstruction in the future should she not ultimately be doing better after therapy.  Plan :    -Return to clinic in 2 weeks work note provided     I personally saw and evaluated the patient, and participated in the management and treatment plan.  Vanetta Mulders, MD Attending Physician, Orthopedic Surgery  This document was dictated using Dragon voice recognition software. A reasonable attempt at proof  reading has been made to minimize errors.

## 2021-09-26 ENCOUNTER — Encounter (HOSPITAL_BASED_OUTPATIENT_CLINIC_OR_DEPARTMENT_OTHER): Payer: Self-pay

## 2021-09-26 ENCOUNTER — Telehealth: Payer: Self-pay | Admitting: Orthopaedic Surgery

## 2021-09-26 ENCOUNTER — Encounter (HOSPITAL_BASED_OUTPATIENT_CLINIC_OR_DEPARTMENT_OTHER): Payer: 59 | Admitting: Physical Therapy

## 2021-09-26 ENCOUNTER — Encounter (HOSPITAL_BASED_OUTPATIENT_CLINIC_OR_DEPARTMENT_OTHER): Payer: Self-pay | Admitting: Orthopaedic Surgery

## 2021-09-26 NOTE — Telephone Encounter (Signed)
09/20/21 ov note&oow note faxed to [facility] [phone]

## 2021-09-30 NOTE — Therapy (Addendum)
OUTPATIENT PHYSICAL THERAPY TREATMENT NOTE   Patient Name: Joanna Galvan MRN: [id number] DOB:1991/03/11, 31 y.o., female Today's Date: 10/01/2021  PCP: Inda Coke, PA    PT End of Session - 10/01/21 1150     Visit Number 2    Number of Visits 16    Date for PT Re-Evaluation 11/13/21    PT Start Time 1110    PT Stop Time 1148    PT Time Calculation (min) 38 min    Activity Tolerance Patient tolerated treatment well    Behavior During Therapy WFL for tasks assessed/performed             Past Medical History:  Diagnosis Date   ADHD (attention deficit hyperactivity disorder)    Anxiety    Asthma    well controlled; bronchitis easily   Depression    GERD (gastroesophageal reflux disease)    H pylori history   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) [facility])    Past Surgical History:  Procedure Laterality Date   CESAREAN SECTION N/A 11/26/2016   Procedure: CESAREAN SECTION;  Surgeon: Paula Compton, MD;  Location: [place];  Service: Obstetrics;  Laterality: N/A;  Jill Side, RNFA   TONSILLECTOMY     Patient Active Problem List   Diagnosis Date Noted   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   Breech presentation delivered 11/26/2016   IUGR (intrauterine growth restriction) affecting care of mother, third trimester, fetus 1 [id number]   Single umbilical artery affecting management of mother in singleton pregnancy, antepartum 11/26/2016   S/P primary low transverse C-section 11/26/2016   Moderate persistent asthma without complication [id number]   Obesity (BMI 30-39.9) 01/04/2015   GAD (generalized anxiety disorder) 01/04/2015    REFERRING PROVIDER: Vanetta Mulders, MD   REFERRING DIAG: S83.004A (ICD-10-CM) - Patellar dislocation, right, initial encounter    THERAPY DIAG:  Right knee pain, unspecified chronicity   Stiffness of right knee, not elsewhere classified   Muscle weakness (generalized)   Difficulty in walking, not elsewhere classified   ONSET  DATE: 07/26/2021   SUBJECTIVE:    SUBJECTIVE STATEMENT: Pt reports improved ambulation though continues have the same amount of swelling.  She has increased swelling if she stands awhile such as an hour.  Pt states the weakness feeling is not as bad without the brace though still occurs. Pt has increased pain with increased standing duration.  Pt is limited with bending and unable to squat.  Pt unable to perform her normal work activities. Pt is limited with taking care of and playing with her son.     Pt is 9 weeks and 4 days s/p R knee patellar dislocation.  Pt denies any adverse effects after prior Rx.  Pt states her knee is not doing great today.  Pt has had no episodes of instability though does feel it like it wants to slide out on occasion with ambulation.  She saw MD and states she was instructed to not use the brace as much and to be out of work for 2 more weeks.   Pt states she has swelling in her R foot and has been elevating at night.  Pt uses ice periodically.         PERTINENT HISTORY: GAD (generalized anxiety disorder), depression   PAIN:  Are you having pain? Yes NPRS scale: 3/10 current, 7-8/10 worst, /10 best Pain location: R knee which is worse in posterior and medial knee     PRECAUTIONS: Other: per dx   WEIGHT BEARING RESTRICTIONS  No     OCCUPATION: Pt is a Environmental consultant. 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   PLOF: Independent; Pt able to perform her normal daily activities, functional mobility, and work activities without pain or limitation.  Pt was able to take care of her son without difficulty.    PATIENT GOALS improve mobility, being able to play with and take care of her 40 year old son, return to PLOF, be able to hike, regain full ROM.     OBJECTIVE:    DIAGNOSTIC FINDINGS:  X ray: R knee  FINDINGS: Moderate joint effusion. No acute bony abnormality. Specifically, no fracture, subluxation, or dislocation. Soft tissues are intact.   IMPRESSION: Moderate joint effusion.  No acute bony abnormality.   MRI: IMPRESSION: 1. Typical osseous findings of transient patellar dislocation injury with bone contusions of the lateral femoral condyle anteriorly in the inferior medial patella. No cortical fracture or focal chondral defect identified. Associated partial tearing at the femoral attachment of the medial patellofemoral ligament. 2. Moderate size knee joint effusion. 3. The menisci, cruciate and collateral ligaments are intact.        OBSERVATION: Pt not wearing J brace.  Swelling in R knee   GIRTH MEASUREMENT: Mid patella R:  39.5 cm, L 38.5     PALPATION: TTP:  medial, inferior, and posterior knee.  Medial, proximal, and central calf.    LE AROM/PROM:   A/PROM Right 10/01/2021 Left 09/19/2021  Hip flexion      Hip extension      Hip abduction      Hip adduction      Hip internal rotation      Hip external rotation      Knee flexion 102/112 deg 142  Knee extension 1 deg of hypextension 10 deg of hyperext  Ankle dorsiflexion      Ankle plantarflexion      Ankle inversion      Ankle eversion       (Blank rows = not tested)     GAIT: Comments: Pt ambulates without brace.  She continues to have a limp though has improved.  She has improved Wb'ing thru R LE.  She has decreased TKE, toe off, and knee flexion.          TODAY'S TREATMENT: -Reviewed current function, HEP compliance, pain level, and response to prior Rx.   -Assessed girth measurement -Pt received R knee flexion and ext PROM -Assessed R knee ROM -Pt performed:   quad sets with 5" hold x 10 reps hooklying hip add isometric with 5" hold x 10 reps.  Supine SLR 2x10 reps--Pt reports 4-5/10 pain SAQ 2x10 reps Supine heel slides x10 reps Supine heel slides with strap x 10 reps  Weight shifts s/s and f/b in staggered stance  10 reps each    Pt received a HEP handout and was educated in correct form and appropriate frequency.   PATIENT  EDUCATION:  Education details: Dx, objective findings, POC, and rationale for exercises.  Reviewed HEP and updated HEP with supine heel slides with strap.  Pt received a HEP handout and was educated in correct form and appropriate frequency.  Pt instructed she should not have increased pain with HEP. Person educated: Patient Education method: Explanation, Demonstration, Tactile cues, Verbal cues, and Handouts Education comprehension: verbalized understanding, returned demonstration, verbal cues required, tactile cues required, and needs further education     HOME EXERCISE PROGRAM: Access Code: [facility] URL: [email or web address] Date: 10/01/2021  Prepared by: Ronny Flurry  Exercises Supine Quadricep Sets - 2 x daily - 7 x weekly - 2 sets - 10 reps - 5 seconds hold Supine Knee Extension Stretch on Towel Roll - 3 x daily - 7 x weekly - 2 reps - 1-2 mins hold Supine Hip Adduction Isometric with Ball - 1-2 x daily - 7 x weekly - 1-2 sets - 10 reps - 5 sec hold Supine Heel Slide with Strap - 2 x daily - 7 x weekly - 2 sets - 10 reps      ASSESSMENT:   CLINICAL IMPRESSION: Patient is a 31 y.o. female 7 weeks and 6 days s/p R patellar dislocation presenting to the clinic with R knee pain, limited ROM in R knee, muscle weakness in R LE and difficulty in walking.  Pt has been using the J brace with ambulation and mobility.  Pt has pain with and is limited with performing functional mobility skills including ambulation and stairs.  She has increased pain with increased standing duration.  Pt is limited with bending and unable to squat.  Pt is unable to perform her normal work activities and is limited with taking care of and playing with her son.    Pt reports MD instructed her to wean out of brace and she presents to Rx without brace.  Pt reports she still feels some instability with gait though hasn't had any episodes of her knee giving way.   Pt has improved quality of gait including  increased Wb'ing thru R LE with reduced limp.  She continues to have a limp though with decreased knee mobility in gait having decreased TKE and heel strike.  Pt demonstrates much improved knee extension having full knee extension AROM today.  She continues to be limited with flexion ROM.  Pt was able to perform supine SLR though had increased knee pain.  Pt responded well to Rx having no increased pain after Rx.  Pt should benefit from continued skilled PT to address goals and impairments and improve overall function.         Objective impairments include Abnormal gait, decreased activity tolerance, decreased endurance, decreased mobility, difficulty walking, decreased ROM, decreased strength, hypomobility, increased edema, impaired flexibility, and pain. These impairments are limiting patient from cleaning, community activity, driving, meal prep, occupation, laundry, shopping, and ambulation .     REHAB POTENTIAL: Good   CLINICAL DECISION MAKING: Stable/uncomplicated   EVALUATION COMPLEXITY: Low     GOALS:     SHORT TERM GOALS:   STG Name Target Date Goal status  1 Pt will be independent and compliant with HEP for improved pain, ROM, strength, and function.  Baseline:  10/09/2021 INITIAL  2 Pt will demo improved R knee extension AROM to 0 deg for improved stiffness, mobility, and gait. Baseline:  10/09/2021 INITIAL  3 Pt will demo improved quality of gait having reduced limp and improved heel strike and toe off Baseline: 10/16/2021 INITIAL  4 Pt will be able to ambulate community distance without increased swelling or significant pain. Baseline: 10/23/2021 INITIAL  5 Pt will be able to perform a 6 inch step up with good control and form.  Baseline: 10/16/2021 INITIAL                      LONG TERM GOALS:    LTG Name Target Date Goal status  1 Pt will deny any knee instability with her normal functional mobility skills.  Baseline: 11/13/2021 INITIAL  2 Pt will be able to perform her  occupational activities without significant pain or limitation.  Baseline: 11/13/2021 INITIAL  3 Pt will be able to ambulate extended community distance without significant pain or difficulty.  Baseline: 11/13/2021 INITIAL  4 Pt will demo 5/5 R hip strength and at least 4+/5 R knee strength for improved tolerance to functional mobility and work activities.  Baseline: 11/13/2021 INITIAL  5 Pt will be able to squat to 60 deg with symmetrical Wb'ing and good form without significant pain for improved fxnl strength and performance of household chores.  Baseline: 11/13/2021 INITIAL  6 Pt will demo a reciprocal gait with stairs with good control. Baseline: 11/13/2021 INITIAL  7 Pt will ambulate with a normalized heel to toe gait without limping 11/13/2021 INITIAL    PLAN: PT FREQUENCY: 2x/week   PT DURATION: 8 weeks   PLANNED INTERVENTIONS: Therapeutic exercises, Therapeutic activity, Neuro Muscular re-education, Balance training, Gait training, Patient/Family education, Joint mobilization, Stair training, DME instructions, Aquatic Therapy, Dry Needling, Electrical stimulation, Cryotherapy, Moist heat, Taping, Ultrasound, and Manual therapy   PLAN FOR NEXT SESSION: Review and perform HEP.  Progress quad and hip strength per pt tolerance and dx.  knee ROM.  Gait training      Ronny Flurry, PT 10/01/2021, 10:58 PM   PHYSICAL THERAPY DISCHARGE SUMMARY  Visits from Start of Care: 2  Current functional level related to goals / functional outcomes: Unable to assess current functional status or goals due to pt not being present at discharge.     Remaining deficits: Unable to assess due to pt not being present discharge.   Education / Equipment: Pt has a HEP   Patient was seen in PT on 09/18/21 and 10/01/21.  She cancelled her following appt due to having a fever.  PT has not heard back from Pt after cancellation.  Pt will be discharged from skilled PT services due to not scheduling any further  PT.   Selinda Michaels III PT, DPT 03/07/22 2:22 PM

## 2021-10-01 ENCOUNTER — Ambulatory Visit (HOSPITAL_BASED_OUTPATIENT_CLINIC_OR_DEPARTMENT_OTHER): Payer: 59 | Attending: Orthopaedic Surgery | Admitting: Physical Therapy

## 2021-10-01 ENCOUNTER — Other Ambulatory Visit: Payer: Self-pay

## 2021-10-01 ENCOUNTER — Encounter (HOSPITAL_BASED_OUTPATIENT_CLINIC_OR_DEPARTMENT_OTHER): Payer: Self-pay | Admitting: Physical Therapy

## 2021-10-01 DIAGNOSIS — M25661 Stiffness of right knee, not elsewhere classified: Secondary | ICD-10-CM | POA: Diagnosis present

## 2021-10-01 DIAGNOSIS — M6281 Muscle weakness (generalized): Secondary | ICD-10-CM | POA: Diagnosis present

## 2021-10-01 DIAGNOSIS — R262 Difficulty in walking, not elsewhere classified: Secondary | ICD-10-CM

## 2021-10-01 DIAGNOSIS — M25561 Pain in right knee: Secondary | ICD-10-CM | POA: Diagnosis not present

## 2021-10-04 ENCOUNTER — Other Ambulatory Visit: Payer: Self-pay

## 2021-10-04 ENCOUNTER — Ambulatory Visit (HOSPITAL_BASED_OUTPATIENT_CLINIC_OR_DEPARTMENT_OTHER): Payer: 59 | Admitting: Orthopaedic Surgery

## 2021-10-04 DIAGNOSIS — S83004A Unspecified dislocation of right patella, initial encounter: Secondary | ICD-10-CM | POA: Diagnosis not present

## 2021-10-04 NOTE — Progress Notes (Signed)
Chief Complaint: right knee pain     History of Present Illness:   10/04/2021: Presents today for follow-up of the right knee.  She denies any recurrent instability involving the knee.  She is working with physical therapy on strengthening as well as range of motion  Joanna Galvan is a 31 y.o. female presents today after patellar dislocation on 9 December.  She was at a metal concert and had someone land on her.  This subsequently resulted in a concussion as well as patella dislocation.  He states that she observe the knee on the lateral aspect of the leg.  She pushed this back into place.  She subsequently went to the emergency room and was given a knee immobilizer and crutches.  She still has swelling and pain about the knee.  There is some difficulty with weightbearing.  She has no prior history of patellar dislocations.  No history of ligamentous laxity.  She is here today with her mother.  She works as a Freight forwarder at a bar.    Surgical History:   None  PMH/PSH/Family History/Social History/Meds/Allergies:    Past Medical History:  Diagnosis Date   ADHD (attention deficit hyperactivity disorder)    Anxiety    Asthma    well controlled; bronchitis easily   Depression    GERD (gastroesophageal reflux disease)    H pylori history   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) [facility])    Past Surgical History:  Procedure Laterality Date   CESAREAN SECTION N/A 11/26/2016   Procedure: CESAREAN SECTION;  Surgeon: Paula Compton, MD;  Location: [place];  Service: Obstetrics;  Laterality: N/A;  Jill Side, RNFA   TONSILLECTOMY     Social History   Socioeconomic History   Marital status: Single    Spouse name: Not on file   Number of children: Not on file   Years of education: Not on file   Highest education level: Not on file  Occupational History   Not on file  Tobacco Use   Smoking status: Former    Packs/day: 0.50    Types:  Cigarettes    Start date: 08/19/2008    Quit date: 12/14/2015    Years since quitting: 5.8   Smokeless tobacco: Never  Vaping Use   Vaping Use: Every day  Substance and Sexual Activity   Alcohol use: Yes    Comment: ocasionally   Drug use: Not Currently    Types: Marijuana    Comment: last used 11/2015   Sexual activity: Yes    Birth control/protection: I.U.D., Condom  Other Topics Concern   Not on file  Social History Narrative   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    Social Determinants of Health   Financial Resource Strain: Not on file  Food Insecurity: Not on file  Transportation Needs: Not on file  Physical Activity: Not on file  Stress: Not on file  Social Connections: Not on file   Family History  Problem Relation Age of Onset   Diabetes Mother    Irritable bowel syndrome Mother    Anxiety disorder Father    ADD / ADHD Father    Depression Sister    Irritable bowel syndrome Sister    COPD Maternal Grandmother    Diabetes Maternal Grandmother  Cancer Maternal Grandmother        breast   COPD Paternal Grandmother    Lung cancer Paternal Grandmother    Allergies  Allergen Reactions   Azithromycin Hives   Bupropion Hives   Buspar [Buspirone]    Cefprozil Hives   Latex Itching and Swelling   Current Outpatient Medications  Medication Sig Dispense Refill   LORazepam (ATIVAN) 1 MG tablet Take 1 tablet (1 mg total) by mouth every 8 (eight) hours. (Patient not taking: Reported on 09/18/2021) 1 tablet 0   guaiFENesin-codeine 100-10 MG/5ML syrup Take 5 mLs by mouth every 6 (six) hours as needed for cough. (Patient not taking: Reported on 09/18/2021) 120 mL 0   HYDROcodone-acetaminophen (NORCO/VICODIN) 5-325 MG tablet Take 1-2 tablets by mouth every 6 (six) hours as needed. (Patient not taking: Reported on 09/18/2021) 6 tablet 0   predniSONE (DELTASONE) 20 MG tablet Take 3 tabs daily for 5 days (Patient not taking: Reported on 09/18/2021) 15 tablet 0   No current  facility-administered medications for this visit.   No results found.  Review of Systems:   A ROS was performed including pertinent positives and negatives as documented in the HPI.  Physical Exam :   Constitutional: NAD and appears stated age Neurological: Alert and oriented Psych: Appropriate affect and cooperative unknown if currently breastfeeding.   Comprehensive Musculoskeletal Exam:      Musculoskeletal Exam  Gait Normal  Alignment Normal   Right Left  Inspection Normal Normal  Palpation    Tenderness Diffuse None  Crepitus None None  Effusion Moderate None  Range of Motion    Extension 0 0  Flexion 30 90  Strength    Extension 5/5 5/5  Flexion 5/5 5/5  Ligament Exam     Generalized Laxity No No  Lachman Negative Negative   Pivot Shift Negative Negative  Anterior Drawer Negative Negative  Valgus at 0 Negative Negative  Valgus at 20 Negative Negative  Varus at 0 0 0  Varus at 20   0 0  Posterior Drawer at 90 0 0  Vascular/Lymphatic Exam    Edema None None  Venous Stasis Changes No No  Distal Circulation Normal Normal  Neurologic    Light Touch Sensation Intact Intact  Special Tests: Positive patellar apprehension     Imaging:   Xray (4 views right knee): Normal  MRI right knee: Torn MPFL with bony bruise pattern consistent with lateral patellar dislocation.  No evidence of chondral injury. I personally reviewed and interpreted the radiographs.   Assessment:   31 year old female with right lateral patellar dislocation after she was hit at a concert.  We did discuss the possibility recurrent instability.  Continues to improve and is working on her range of motion.  She does not have any recurrent instability.  Overall she has been able to wean out of her brace as well.  Asking for return to work at this time. Plan :    -Return to work in 4 weeks for final range of motion check     I personally saw and evaluated the patient, and participated in  the management and treatment plan.  Vanetta Mulders, MD Attending Physician, Orthopedic Surgery  This document was dictated using Dragon voice recognition software. A reasonable attempt at proof reading has been made to minimize errors.

## 2021-10-18 ENCOUNTER — Ambulatory Visit (HOSPITAL_BASED_OUTPATIENT_CLINIC_OR_DEPARTMENT_OTHER): Payer: 59 | Admitting: Physical Therapy

## 2021-11-01 ENCOUNTER — Ambulatory Visit (HOSPITAL_BASED_OUTPATIENT_CLINIC_OR_DEPARTMENT_OTHER): Payer: 59 | Admitting: Orthopaedic Surgery

## 2022-01-10 ENCOUNTER — Encounter (HOSPITAL_BASED_OUTPATIENT_CLINIC_OR_DEPARTMENT_OTHER): Payer: Self-pay

## 2022-01-10 ENCOUNTER — Emergency Department (HOSPITAL_BASED_OUTPATIENT_CLINIC_OR_DEPARTMENT_OTHER)
Admission: EM | Admit: 2022-01-10 | Discharge: 2022-01-10 | Disposition: A | Discharge status: Home / Self Care | Payer: 59 | Attending: Emergency Medicine | Admitting: Emergency Medicine

## 2022-01-10 ENCOUNTER — Ambulatory Visit: Payer: 59 | Admitting: Physician Assistant

## 2022-01-10 ENCOUNTER — Other Ambulatory Visit: Payer: Self-pay

## 2022-01-10 ENCOUNTER — Encounter: Payer: Self-pay | Admitting: Physician Assistant

## 2022-01-10 ENCOUNTER — Emergency Department (HOSPITAL_BASED_OUTPATIENT_CLINIC_OR_DEPARTMENT_OTHER): Payer: 59

## 2022-01-10 VITALS — BP 110/70 | HR 106 | Temp 98.0°F | Ht 62.0 in | Wt 185.5 lb

## 2022-01-10 DIAGNOSIS — E876 Hypokalemia: Secondary | ICD-10-CM | POA: Insufficient documentation

## 2022-01-10 DIAGNOSIS — Z91040 Latex allergy status: Secondary | ICD-10-CM | POA: Insufficient documentation

## 2022-01-10 DIAGNOSIS — D72829 Elevated white blood cell count, unspecified: Secondary | ICD-10-CM | POA: Insufficient documentation

## 2022-01-10 DIAGNOSIS — J029 Acute pharyngitis, unspecified: Secondary | ICD-10-CM

## 2022-01-10 DIAGNOSIS — J020 Streptococcal pharyngitis: Secondary | ICD-10-CM | POA: Insufficient documentation

## 2022-01-10 DIAGNOSIS — R000 Tachycardia, unspecified: Secondary | ICD-10-CM | POA: Diagnosis not present

## 2022-01-10 LAB — CBC
HCT: 41.5 % (ref 36.0–46.0)
Hemoglobin: 13.5 g/dL (ref 12.0–15.0)
MCH: 26.4 pg (ref 26.0–34.0)
MCHC: 32.5 g/dL (ref 30.0–36.0)
MCV: 81.1 fL (ref 80.0–100.0)
Platelets: 244 10*3/uL (ref 150–400)
RBC: 5.12 MIL/uL — ABNORMAL HIGH (ref 3.87–5.11)
RDW: 12.8 % (ref 11.5–15.5)
WBC: 13.9 10*3/uL — ABNORMAL HIGH (ref 4.0–10.5)
nRBC: 0 % (ref 0.0–0.2)

## 2022-01-10 LAB — BASIC METABOLIC PANEL
Anion gap: 12 (ref 5–15)
BUN: 11 mg/dL (ref 6–20)
CO2: 26 mmol/L (ref 22–32)
Calcium: 9.7 mg/dL (ref 8.9–10.3)
Chloride: 100 mmol/L (ref 98–111)
Creatinine, Ser: 0.8 mg/dL (ref 0.44–1.00)
GFR, Estimated: >60 mL/min (ref >60)
Glucose, Bld: 90 mg/dL (ref 70–99)
Potassium: 3.4 mmol/L — ABNORMAL LOW (ref 3.5–5.1)
Sodium: 138 mmol/L (ref 135–145)

## 2022-01-10 LAB — POCT RAPID STREP A (OFFICE): Rapid Strep A Screen: POSITIVE — AB

## 2022-01-10 MED ORDER — SODIUM CHLORIDE 0.9 % IV BOLUS
1000.0000 mL | Freq: Once | INTRAVENOUS | Status: AC
  Administered 2022-01-10: 1000 mL via INTRAVENOUS

## 2022-01-10 MED ORDER — ACETAMINOPHEN 500 MG PO TABS
1000.0000 mg | ORAL_TABLET | Freq: Once | ORAL | Status: AC
  Administered 2022-01-10: 1000 mg via ORAL
  Filled 2022-01-10: qty 2

## 2022-01-10 MED ORDER — ONDANSETRON HCL 4 MG/2ML IJ SOLN
4.0000 mg | Freq: Once | INTRAMUSCULAR | Status: AC
  Administered 2022-01-10: 4 mg via INTRAVENOUS
  Filled 2022-01-10: qty 2

## 2022-01-10 MED ORDER — IOHEXOL 300 MG/ML  SOLN
75.0000 mL | Freq: Once | INTRAMUSCULAR | Status: AC | PRN
  Administered 2022-01-10: 75 mL via INTRAVENOUS

## 2022-01-10 MED ORDER — PENICILLIN G BENZATHINE 1200000 UNIT/2ML IM SUSY
1.2000 10*6.[IU] | PREFILLED_SYRINGE | Freq: Once | INTRAMUSCULAR | Status: AC
  Administered 2022-01-10: 1.2 10*6.[IU] via INTRAMUSCULAR
  Filled 2022-01-10: qty 2

## 2022-01-10 MED ORDER — LIDOCAINE VISCOUS HCL 2 % MT SOLN: 15.0000 mL | OROMUCOSAL | 0 refills | Status: DC | PRN

## 2022-01-10 MED ORDER — DEXAMETHASONE SODIUM PHOSPHATE 10 MG/ML IJ SOLN
10.0000 mg | Freq: Once | INTRAMUSCULAR | Status: AC
  Administered 2022-01-10: 10 mg via INTRAVENOUS
  Filled 2022-01-10: qty 1

## 2022-01-10 MED ORDER — ONDANSETRON HCL 4 MG PO TABS: 4.0000 mg | ORAL_TABLET | Freq: Four times a day (QID) | ORAL | 0 refills | Status: DC

## 2022-01-10 NOTE — Progress Notes (Signed)
Joanna Galvan is a 31 y.o. female here for a new problem.  History of Present Illness:   Chief Complaint  Patient presents with   Sore Throat    Pt c/o sore throat, body aches, fever x 3 days 101-103. This morning at 7:00 AM was 102.7 took Advil. She has not been able to eat, drinking some. Did COVID test this morning Negative.    HPI  Sore Throat  Patient complain of sore throat that has been onset for the past 3 days. Her associated symptoms include fever, body aches and throat swelling. Has had fever of 102.7 this morning. She has taken Ibuprofen every 6 hours with no relief. Took Covid test at home which was negative. States she is unable to eat or drink for the past 3 days. She notes her whole body has been hurting. 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 Has had some ear pain and  pressure. She does have a hx of asthma.    Past Medical History:  Diagnosis Date   ADHD (attention deficit hyperactivity disorder)    Anxiety    Asthma    well controlled; bronchitis easily   Depression    GERD (gastroesophageal reflux disease)    H pylori history   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) ([facility])      Social History   Tobacco Use   Smoking status: Former    Packs/day: 0.50    Types: Cigarettes    Start date: 08/19/2008    Quit date: 12/14/2015    Years since quitting: 6.0   Smokeless tobacco: Never  Vaping Use   Vaping Use: Every day  Substance Use Topics   Alcohol use: Yes    Comment: ocasionally   Drug use: Not Currently    Types: Marijuana    Comment: last used 11/2015    Past Surgical History:  Procedure Laterality Date   CESAREAN SECTION N/A 11/26/2016   Procedure: CESAREAN SECTION;  Surgeon: Huel Cote, MD;  Location: [facility];  Service: Obstetrics;  Laterality: N/A;  Heather K, RNFA   TONSILLECTOMY      Family History  Problem Relation Age of Onset   Diabetes Mother    Irritable bowel syndrome Mother    Anxiety disorder  Father    ADD / ADHD Father    Depression Sister    Irritable bowel syndrome Sister    COPD Maternal Grandmother    Diabetes Maternal Grandmother    Cancer Maternal Grandmother        breast   COPD Paternal Grandmother    Lung cancer Paternal Grandmother     Allergies  Allergen Reactions   Azithromycin Hives   Bupropion Hives   Buspar [Buspirone]    Cefprozil Hives   Latex Itching and Swelling    Current Medications:   Current Outpatient Medications:    Ibuprofen (ADVIL) 200 MG CAPS, Take 400 mg by mouth as needed., Disp: , Rfl:    Review of Systems:   ROS Negative unless otherwise specified per HPI.   Vitals:   Vitals:   01/10/22 0905  BP: 110/70  Pulse: (!) 106  Temp: 98 F (36.7 C)  TempSrc: Temporal  SpO2: 97%  Weight: 185 lb 8 oz (84.1 kg)  Height: 5\' 2"  (1.575 m)     Body mass index is 33.93 kg/m.  Physical Exam:   Physical Exam Vitals and nursing note reviewed.  Constitutional:      General: She is not  in acute distress.    Appearance: She is well-developed. She is ill-appearing. She is not toxic-appearing.  HENT:     Head: Normocephalic and atraumatic.     Right Ear: Tympanic membrane, ear canal and external ear normal. Tympanic membrane is not erythematous, retracted or bulging.     Left Ear: Tympanic membrane, ear canal and external ear normal. Tympanic membrane is not erythematous, retracted or bulging.     Nose: Nose normal.     Right Sinus: No maxillary sinus tenderness or frontal sinus tenderness.     Left Sinus: No maxillary sinus tenderness or frontal sinus tenderness.     Mouth/Throat:     Pharynx: Uvula midline. Posterior oropharyngeal erythema present.     Comments: Difficult to assess; but no significant tonsillar hypertrophy Eyes:     General: Lids are normal.     Conjunctiva/sclera: Conjunctivae normal.  Neck:     Trachea: Trachea normal.  Cardiovascular:     Rate and Rhythm: Normal rate and regular rhythm.     Heart sounds:  Normal heart sounds, S1 normal and S2 normal.  Pulmonary:     Effort: Pulmonary effort is normal.     Breath sounds: Normal breath sounds. No decreased breath sounds, wheezing, rhonchi or rales.  Lymphadenopathy:     Head:     Right side of head: Submandibular adenopathy present.     Left side of head: Submandibular adenopathy present.     Cervical: Cervical adenopathy present.  Skin:    General: Skin is warm and dry.  Neurological:     Mental Status: She is alert.  Psychiatric:        Speech: Speech normal.        Behavior: Behavior normal. Behavior is cooperative.   Results for orders placed or performed in visit on 01/10/22  POCT rapid strep A  Result Value Ref Range   Rapid Strep A Screen Positive (A) Negative     Assessment and Plan:   Sore throat Strep is positive She is quite dehydrated on exam and is unable tolerate much by mouth Recommend ER evaluation for possible IVF and blood work/imaging, if indicated Patient is agreeable to plan  I,Savera Zaman,acting as a scribe for [employer], PA.,have documented all relevant documentation on the behalf of Jarold Motto, PA,as directed by  Jarold Motto, PA while in the presence of Jarold Motto, [place].   I, Jarold Motto, [place], have reviewed all documentation for this visit. The documentation on 01/10/22 for the exam, diagnosis, procedures, and orders are all accurate and complete.   Jarold Motto, PA-C

## 2022-01-10 NOTE — ED Provider Notes (Signed)
[facility] EMERGENCY DEPT Provider Note   CSN: [id number] Arrival date & time: 01/10/22  0941     History  Chief Complaint  Patient presents with   Sore Throat    Joanna Galvan is a 31 y.o. female with past medical history significant for previous tonsillectomy greater than 1 decade ago who presents with concern for sore throat, fatigue, malaise, body aches, concern for dehydration.  Patient reports that she was seen by her doctor this morning tested positive for strep, her doctor noted some swelling of the throat and white exudate that is lower than position of tonsils, noted patient's clinical dehydration, and sent her to the emergency department for possible fluids, IV antibiotics, and imaging of the throat as necessary.  Patient does report some shortness of breath.  She reports she has not had anything to eat for 4 days secondary to pain, difficulty swallowing.  She denies any chest pain, dysuria, vomiting, diarrhea, abdominal pain.  She denies any headache, vision changes.  She denies history of recurrent throat infections status post tonsillectomy.  She reports that she had a fever of 102.7 earlier this morning, last took some Advil at around 7 or 8.   Sore Throat      Home Medications Prior to Admission medications   Medication Sig Start Date End Date Taking? Authorizing Provider  lidocaine (XYLOCAINE) 2 % solution Use as directed 15 mLs in the mouth or throat as needed for mouth pain. 01/10/22  Yes Elpidia Karn H, PA-C  ondansetron (ZOFRAN) 4 MG tablet Take 1 tablet (4 mg total) by mouth every 6 (six) hours. 01/10/22  Yes Dorothie Wah H, PA-C  Ibuprofen (ADVIL) 200 MG CAPS Take 400 mg by mouth as needed.    [provider]      Allergies    Azithromycin, Bupropion, Buspar [buspirone], Cefprozil, and Latex    Review of Systems   Review of Systems  Constitutional:  Positive for fatigue and fever.  HENT:  Positive for sore throat.    Gastrointestinal:  Positive for nausea.  All other systems reviewed and are negative.  Physical Exam Updated Vital Signs BP 114/78   Pulse 80   Temp 98.4 F (36.9 C) (Oral)   Resp 17   Ht 5\' 3"  (1.6 m)   Wt 83.9 kg   SpO2 97%   BMI 32.77 kg/m  Physical Exam Vitals and nursing note reviewed.  Constitutional:      General: She is not in acute distress.    Appearance: Normal appearance. She is ill-appearing.  HENT:     Head: Normocephalic and atraumatic.     Mouth/Throat:     Mouth: Mucous membranes are dry.     Comments: Patient with erythematous posterior oropharynx, her tonsils are absent, however she does have some swelling of the soft tissue extending down into the distal posterior oropharynx with white exudate.  Uvula is midline.  No obvious peritonsillar abscess noted in visualized pharynx. Eyes:     General:        Right eye: No discharge.        Left eye: No discharge.  Cardiovascular:     Rate and Rhythm: Regular rhythm. Tachycardia present.     Heart sounds: No murmur heard.   No friction rub. No gallop.  Pulmonary:     Effort: Pulmonary effort is normal.     Breath sounds: Normal breath sounds.     Comments: Normal respirations bilaterally, no respiratory distress, no wheezing, rhonchi.  No stridor noted. Abdominal:     General: Bowel sounds are normal.     Palpations: Abdomen is soft.  Skin:    General: Skin is warm and dry.     Capillary Refill: Capillary refill takes less than 2 seconds.     Coloration: Skin is pale.  Neurological:     Mental Status: She is alert and oriented to person, place, and time.  Psychiatric:        Mood and Affect: Mood normal.        Behavior: Behavior normal.    ED Results / Procedures / Treatments   Labs (all labs ordered are listed, but only abnormal results are displayed) Labs Reviewed  CBC - Abnormal; Notable for the following components:      Result Value   WBC 13.9 (*)    RBC 5.12 (*)    All other components  within normal limits  BASIC METABOLIC PANEL - Abnormal; Notable for the following components:   Potassium 3.4 (*)    All other components within normal limits    EKG None  Radiology CT Soft Tissue Neck W Contrast  Result Date: 01/10/2022 CLINICAL DATA:  Epiglottitis or tonsillitis suspected. Sore throat over the last 3 days. Fever and swelling. EXAM: CT NECK WITH CONTRAST TECHNIQUE: Multidetector CT imaging of the neck was performed using the standard protocol following the bolus administration of intravenous contrast. RADIATION DOSE REDUCTION: This exam was performed according to the departmental dose-optimization program which includes automated exposure control, adjustment of the mA and/or kV according to patient size and/or use of iterative reconstruction technique. CONTRAST:  97mL OMNIPAQUE IOHEXOL 300 MG/ML  SOLN COMPARISON:  None FINDINGS: Pharynx and larynx: Bilateral tonsillitis pattern with more swelling on the right than the left. No sign of tonsillar or peritonsillar abscess. Lingual tonsillar tissue is also markedly enlarged. These changes are presumed to be inflammatory. Follow-up to resolution is suggested to exclude the unlikely possibility of mass lesion. No other mucosal or submucosal finding. Salivary glands: Parotid and submandibular glands are normal. Thyroid: Normal Lymph nodes: Mild bilateral reactive nodal prominence but without evidence of dominant node or suppuration. Vascular: No abnormal vascular finding. Limited intracranial: Normal Visualized orbits: Normal Mastoids and visualized paranasal sinuses: Retention cysts of the maxillary sinuses. No widespread mucosal thickening or layering fluid. Skeleton: Normal Upper chest: Normal Other: None IMPRESSION: Tonsillar enlargement, including of the lingual tonsillar tissue, right more than left. No evidence of tonsillar or peritonsillar abscess or parapharyngeal space inflammatory disease. Findings are felt likely to be  inflammatory. Follow-up to resolution is suggested to exclude the unlikely possibility of neoplastic enlargement. Reactive bilateral cervical chain prominence without dominant node or suppuration. Electronically Signed   By: Paulina Fusi M.D.   On: 01/10/2022 13:54    Procedures Procedures    Medications Ordered in ED Medications  penicillin g benzathine (BICILLIN LA) 1200000 UNIT/2ML injection 1.2 Million Units (has no administration in time range)  sodium chloride 0.9 % bolus 1,000 mL (0 mLs Intravenous Stopped 01/10/22 1331)  acetaminophen (TYLENOL) tablet 1,000 mg (1,000 mg Oral Given 01/10/22 1234)  dexamethasone (DECADRON) injection 10 mg (10 mg Intravenous Given 01/10/22 1234)  ondansetron (ZOFRAN) injection 4 mg (4 mg Intravenous Given 01/10/22 1234)  iohexol (OMNIPAQUE) 300 MG/ML solution 75 mL (75 mLs Intravenous Contrast Given 01/10/22 1337)    ED Course/ Medical Decision Making/ A&P  Medical Decision Making Amount and/or Complexity of Data Reviewed Labs: ordered. Radiology: ordered.  Risk OTC drugs. Prescription drug management.   This patient is a 31 y.o. female who presents to the ED for concern of sore throat, dehydration, malaise, fatigue, this involves an extensive number of treatment options, and is a complaint that carries with it a high risk of complications and morbidity. The emergent differential diagnosis prior to evaluation includes, but is not limited to, Peritonsillar abscess, epiglottitis, Ludwig angina, or other ENT emergency, loss of airway, versus simple streptococcal pharyngitis versus other.  This is not an exhaustive differential.   Past Medical History / Co-morbidities / Social History: Previous history of tonsillectomy, obesity, anxiety, asthma  Additional history: Chart reviewed. Pertinent results include: Reviewed family medicine visit just prior to arrival, as well as outpatient orthopedic visits, urgent care visits, lab  work and imaging from previous emergency department visits.  Physical Exam: Physical exam performed. The pertinent findings include: Patient does have some swelling of the lower posterior oropharynx with exudate noted.  She does not have tonsils present.  She does not have acute stridor, but she does have some increased work of breathing.  She is clinically dehydrated, ill-appearing.  She is minimally tachycardic, pulse 106 on arrival.  Lab Tests: I ordered, and personally interpreted labs.  The pertinent results include: Leukocytosis, white blood cells 13.9 consistent with acute bacterial infection, patient with known streptococcal pharyngitis.  BMP is overall unremarkable with very mild hypokalemia, potassium 3.4.   Imaging Studies: I ordered imaging studies including CT soft tissue neck with contrast. I independently visualized and interpreted imaging which showed tonsillar swelling, with cervical lymphadenopathy consistent with streptococcal pharyngitis, tonsillitis, without evidence of epiglottitis, airway compromise.. I agree with the radiologist interpretation.     Medications: I ordered medication including Decadron, fluid bolus, Tylenol, Zofran, penicillin  for throat swelling, dehydration, treatment of streptococcal pharyngitis, nausea. Reevaluation of the patient after these medicines showed that the patient improved. I have reviewed the patients home medicines and have made adjustments as needed.  Will discharge patient with prescription for Zofran, as well as Viscous lidocaine to help her get food on her stomach.   Disposition: After consideration of the diagnostic results and the patients response to treatment, I feel that patient appears stable for discharge at this time, she shows no signs of impending respiratory compromise, she continues to have no stridor, her vital signs are stable after treatment, and presentation is consistent with trip to coccal pharyngitis which can be managed  at home.  Encourage plenty of fluids, rest, increase activity as tolerated.   I discussed this case with my attending physician Dr. Rhunette CroftNanavati who cosigned this note including patient's presenting symptoms, physical exam, and planned diagnostics and interventions. Attending physician stated agreement with plan or made changes to plan which were implemented.    Final Clinical Impression(s) / ED Diagnoses Final diagnoses:  Streptococcal pharyngitis    Rx / DC Orders ED Discharge Orders          Ordered    ondansetron (ZOFRAN) 4 MG tablet  Every 6 hours        01/10/22 1416    lidocaine (XYLOCAINE) 2 % solution  As needed        01/10/22 1416              Yarelli Decelles, La Grullahristian H, PA-C 01/10/22 1423    Derwood KaplanNanavati, Ankit, MD 01/11/22 [id number]

## 2022-01-10 NOTE — Discharge Instructions (Addendum)
Please use Tylenol or ibuprofen for pain, fever.  You may use 600 mg ibuprofen every 6 hours or 1000 mg of Tylenol every 6 hours.  You may choose to alternate between the 2.  This would be most effective.  Not to exceed 4 g of Tylenol within 24 hours.  Not to exceed 3200 mg ibuprofen 24 hours.  You can use the nausea medication as well as the viscous lidocaine that I prescribed to help with sore throat, nausea, to help get something in your stomach.  I encourage you to continue drink plenty fluids.  You can return to work when you are fever free for 24 hours without having to use ibuprofen, Tylenol.  If your symptoms worsen, you develop difficulty breathing please return to the emergency department for further evaluation.

## 2022-01-10 NOTE — ED Triage Notes (Signed)
Pt. States she went to doctor this am and tested positive for strep. States doctor  told pt. She may need IV antibiotics and fluids; also stated they suggested imaging.

## 2022-01-10 NOTE — Patient Instructions (Addendum)
It was great to see you!  Please go to the ER at [facility] for IV fluids and further evaluation.  Take care,  Jarold Motto PA-C

## 2022-01-10 NOTE — ED Notes (Signed)
Reviewed AVS/discharge instruction with patient. Time allotted for and all questions answered. Patient is agreeable for d/c and escorted to ed exit by staff.  

## 2022-01-11 ENCOUNTER — Encounter: Payer: Self-pay | Admitting: Physician Assistant

## 2022-01-11 ENCOUNTER — Emergency Department (HOSPITAL_BASED_OUTPATIENT_CLINIC_OR_DEPARTMENT_OTHER)
Admission: EM | Admit: 2022-01-11 | Discharge: 2022-01-11 | Disposition: A | Discharge status: Home / Self Care | Payer: 59 | Attending: Emergency Medicine | Admitting: Emergency Medicine

## 2022-01-11 ENCOUNTER — Other Ambulatory Visit: Payer: Self-pay

## 2022-01-11 ENCOUNTER — Encounter (HOSPITAL_BASED_OUTPATIENT_CLINIC_OR_DEPARTMENT_OTHER): Payer: Self-pay

## 2022-01-11 ENCOUNTER — Emergency Department (HOSPITAL_BASED_OUTPATIENT_CLINIC_OR_DEPARTMENT_OTHER): Payer: 59

## 2022-01-11 DIAGNOSIS — Z91040 Latex allergy status: Secondary | ICD-10-CM | POA: Diagnosis not present

## 2022-01-11 DIAGNOSIS — R1033 Periumbilical pain: Secondary | ICD-10-CM | POA: Insufficient documentation

## 2022-01-11 DIAGNOSIS — D72829 Elevated white blood cell count, unspecified: Secondary | ICD-10-CM | POA: Diagnosis not present

## 2022-01-11 DIAGNOSIS — R1013 Epigastric pain: Secondary | ICD-10-CM | POA: Insufficient documentation

## 2022-01-11 DIAGNOSIS — R0789 Other chest pain: Secondary | ICD-10-CM | POA: Insufficient documentation

## 2022-01-11 DIAGNOSIS — K5900 Constipation, unspecified: Secondary | ICD-10-CM | POA: Insufficient documentation

## 2022-01-11 DIAGNOSIS — R110 Nausea: Secondary | ICD-10-CM | POA: Diagnosis not present

## 2022-01-11 DIAGNOSIS — R1010 Upper abdominal pain, unspecified: Secondary | ICD-10-CM | POA: Diagnosis present

## 2022-01-11 DIAGNOSIS — J45909 Unspecified asthma, uncomplicated: Secondary | ICD-10-CM | POA: Insufficient documentation

## 2022-01-11 DIAGNOSIS — R079 Chest pain, unspecified: Secondary | ICD-10-CM

## 2022-01-11 LAB — URINALYSIS, ROUTINE W REFLEX MICROSCOPIC
Bilirubin Urine: NEGATIVE
Glucose, UA: 100 mg/dL — AB
Hgb urine dipstick: NEGATIVE
Nitrite: NEGATIVE
Protein, ur: 30 mg/dL — AB
Specific Gravity, Urine: 1.038 — ABNORMAL HIGH (ref 1.005–1.030)
pH: 5.5 (ref 5.0–8.0)

## 2022-01-11 LAB — BASIC METABOLIC PANEL
Anion gap: 9 (ref 5–15)
BUN: 14 mg/dL (ref 6–20)
CO2: 27 mmol/L (ref 22–32)
Calcium: 9.7 mg/dL (ref 8.9–10.3)
Chloride: 105 mmol/L (ref 98–111)
Creatinine, Ser: 0.75 mg/dL (ref 0.44–1.00)
GFR, Estimated: >60 mL/min (ref >60)
Glucose, Bld: 106 mg/dL — ABNORMAL HIGH (ref 70–99)
Potassium: 3.7 mmol/L (ref 3.5–5.1)
Sodium: 141 mmol/L (ref 135–145)

## 2022-01-11 LAB — CBC
HCT: 41.6 % (ref 36.0–46.0)
Hemoglobin: 13.5 g/dL (ref 12.0–15.0)
MCH: 26 pg (ref 26.0–34.0)
MCHC: 32.5 g/dL (ref 30.0–36.0)
MCV: 80.2 fL (ref 80.0–100.0)
Platelets: 381 10*3/uL (ref 150–400)
RBC: 5.19 MIL/uL — ABNORMAL HIGH (ref 3.87–5.11)
RDW: 12.9 % (ref 11.5–15.5)
WBC: 24.7 10*3/uL — ABNORMAL HIGH (ref 4.0–10.5)
nRBC: 0 % (ref 0.0–0.2)

## 2022-01-11 LAB — PREGNANCY, URINE: Preg Test, Ur: NEGATIVE

## 2022-01-11 LAB — TROPONIN I (HIGH SENSITIVITY)
Troponin I (High Sensitivity): 2 ng/L (ref <18)
Troponin I (High Sensitivity): 2 ng/L (ref <18)

## 2022-01-11 LAB — D-DIMER, QUANTITATIVE: D-Dimer, Quant: 0.46 ug/mL-FEU (ref 0.00–0.50)

## 2022-01-11 MED ORDER — ALUM & MAG HYDROXIDE-SIMETH 200-200-20 MG/5ML PO SUSP
30.0000 mL | Freq: Once | ORAL | Status: AC
  Administered 2022-01-11: 30 mL via ORAL
  Filled 2022-01-11: qty 30

## 2022-01-11 MED ORDER — LACTATED RINGERS IV BOLUS
1000.0000 mL | Freq: Once | INTRAVENOUS | Status: AC
  Administered 2022-01-11: 1000 mL via INTRAVENOUS

## 2022-01-11 MED ORDER — FENTANYL CITRATE PF 50 MCG/ML IJ SOSY
50.0000 ug | PREFILLED_SYRINGE | Freq: Once | INTRAMUSCULAR | Status: AC
  Administered 2022-01-11: 50 ug via INTRAVENOUS
  Filled 2022-01-11: qty 1

## 2022-01-11 MED ORDER — LIDOCAINE VISCOUS HCL 2 % MT SOLN
15.0000 mL | Freq: Once | OROMUCOSAL | Status: AC
  Administered 2022-01-11: 15 mL via ORAL
  Filled 2022-01-11: qty 15

## 2022-01-11 MED ORDER — IOHEXOL 300 MG/ML  SOLN
100.0000 mL | Freq: Once | INTRAMUSCULAR | Status: AC | PRN
  Administered 2022-01-11: 100 mL via INTRAVENOUS

## 2022-01-11 MED ORDER — PANTOPRAZOLE SODIUM 20 MG PO TBEC: 20.0000 mg | DELAYED_RELEASE_TABLET | Freq: Every day | ORAL | 0 refills | Status: DC

## 2022-01-11 NOTE — Discharge Instructions (Addendum)
You were seen in the ER for evaluation for evaluation of your chest pain and abdominal pain. Your labs showed dehydration, otherwise normal. You CT imaging showed a low riding IUD, otherwise normal. I think your abdominal and chest pain is likely from your stress worsening your GERD. Your EKG was normal and your labs are negative for any blood clot or heart attack. Please follow up with your PCP for re-evaluation in the next few days. I have sent you home with pantoprazole to take once daily for the next two weeks to help with your pain. Please make sure you are drinking plenty of fluids, mainly water. If you have any concern, new or worsening symptoms, please return to the ER for re-evaluation.   Call your GYN to let them know about your low riding IUD for possible outpatient ultrasound.   Contact a doctor if: Your chest pain does not go away. You feel depressed. You have a fever. Get help right away if: Your chest pain is worse. You have a cough that gets worse, or you cough up blood. You have very bad (severe) pain in your belly (abdomen). You pass out (faint). You have either of these for no clear reason: Sudden chest discomfort. Sudden discomfort in your arms, back, neck, or jaw. You have shortness of breath at any time. You suddenly start to sweat, or your skin gets clammy. You feel sick to your stomach (nauseous). You throw up (vomit). You suddenly feel lightheaded or dizzy. You feel very weak or tired. Your heart starts to beat fast, or it feels like it is skipping beats. These symptoms may be an emergency. Do not wait to see if the symptoms will go away. Get medical help right away. Call your local emergency services (911 in the [place] Do not drive yourself to the hospital.

## 2022-01-11 NOTE — ED Triage Notes (Signed)
Pt presents today with abd pain, back pain, chest pain, diaphoretic, confused, "grey colored skin" and fatigue. Pt seen here yesterday for strep.

## 2022-01-11 NOTE — ED Provider Notes (Signed)
[facility]   CSN: [id number] Arrival date & time: 01/11/22  1642     History {Add pertinent medical, surgical, social history, OB history to HPI:1} Chief Complaint  Patient presents with   Abdominal Pain   Chest Pain    Joanna Galvan is a 31 y.o. female.   Abdominal Pain Associated symptoms: chest pain   Chest Pain Associated symptoms: abdominal pain       Home Medications Prior to Admission medications   Medication Sig Start Date End Date Taking? Authorizing Provider  pantoprazole (PROTONIX) 20 MG tablet Take 1 tablet (20 mg total) by mouth daily. 01/11/22  Yes Achille Richansom, Nevena Rozenberg, PA-C  Ibuprofen (ADVIL) 200 MG CAPS Take 400 mg by mouth as needed.    [provider]  lidocaine (XYLOCAINE) 2 % solution Use as directed 15 mLs in the mouth or throat as needed for mouth pain. 01/10/22   Prosperi, Christian H, PA-C  ondansetron (ZOFRAN) 4 MG tablet Take 1 tablet (4 mg total) by mouth every 6 (six) hours. 01/10/22   Prosperi, Christian H, PA-C      Allergies    Azithromycin, Bupropion, Buspar [buspirone], Cefprozil, and Latex    Review of Systems   Review of Systems  Cardiovascular:  Positive for chest pain.  Gastrointestinal:  Positive for abdominal pain.   Physical Exam Updated Vital Signs BP 110/72   Pulse 67   Temp 97.8 F (36.6 C)   Resp 20   SpO2 98%  Physical Exam  ED Results / Procedures / Treatments   Labs (all labs ordered are listed, but only abnormal results are displayed) Labs Reviewed  BASIC METABOLIC PANEL - Abnormal; Notable for the following components:      Result Value   Glucose, Bld 106 (*)    All other components within normal limits  CBC - Abnormal; Notable for the following components:   WBC 24.7 (*)    RBC 5.19 (*)    All other components within normal limits  URINALYSIS, ROUTINE W REFLEX MICROSCOPIC - Abnormal; Notable for the following components:   Specific Gravity, Urine 1.038 (*)     Glucose, UA 100 (*)    Ketones, ur TRACE (*)    Protein, ur 30 (*)    Leukocytes,Ua SMALL (*)    All other components within normal limits  PREGNANCY, URINE  D-DIMER, QUANTITATIVE  TROPONIN I (HIGH SENSITIVITY)  TROPONIN I (HIGH SENSITIVITY)    EKG EKG Interpretation  Date/Time:  Friday Jan 11 2022 17:54:12 EDT Ventricular Rate:  81 PR Interval:  128 QRS Duration: 84 QT Interval:  362 QTC Calculation: 420 R Axis:   64 Text Interpretation: Normal sinus rhythm Normal ECG No previous ECGs available normal, no old comparison Confirmed by Arby BarrettePfeiffer, Marcy [phone]) on 01/11/2022 7:38:51 PM  Radiology CT Soft Tissue Neck W Contrast  Result Date: 01/10/2022 CLINICAL DATA:  Epiglottitis or tonsillitis suspected. Sore throat over the last 3 days. Fever and swelling. EXAM: CT NECK WITH CONTRAST TECHNIQUE: Multidetector CT imaging of the neck was performed using the standard protocol following the bolus administration of intravenous contrast. RADIATION DOSE REDUCTION: This exam was performed according to the departmental dose-optimization program which includes automated exposure control, adjustment of the mA and/or kV according to patient size and/or use of iterative reconstruction technique. CONTRAST:  75mL OMNIPAQUE IOHEXOL 300 MG/ML  SOLN COMPARISON:  None FINDINGS: Pharynx and larynx: Bilateral tonsillitis pattern with more swelling on the right than the left. No sign of  tonsillar or peritonsillar abscess. Lingual tonsillar tissue is also markedly enlarged. These changes are presumed to be inflammatory. Follow-up to resolution is suggested to exclude the unlikely possibility of mass lesion. No other mucosal or submucosal finding. Salivary glands: Parotid and submandibular glands are normal. Thyroid: Normal Lymph nodes: Mild bilateral reactive nodal prominence but without evidence of dominant node or suppuration. Vascular: No abnormal vascular finding. Limited intracranial: Normal Visualized orbits:  Normal Mastoids and visualized paranasal sinuses: Retention cysts of the maxillary sinuses. No widespread mucosal thickening or layering fluid. Skeleton: Normal Upper chest: Normal Other: None IMPRESSION: Tonsillar enlargement, including of the lingual tonsillar tissue, right more than left. No evidence of tonsillar or peritonsillar abscess or parapharyngeal space inflammatory disease. Findings are felt likely to be inflammatory. Follow-up to resolution is suggested to exclude the unlikely possibility of neoplastic enlargement. Reactive bilateral cervical chain prominence without dominant node or suppuration. Electronically Signed   By: Paulina Fusi M.D.   On: 01/10/2022 13:54   CT ABDOMEN PELVIS W CONTRAST  Result Date: 01/11/2022 CLINICAL DATA:  Acute abdominal pain. EXAM: CT ABDOMEN AND PELVIS WITH CONTRAST TECHNIQUE: Multidetector CT imaging of the abdomen and pelvis was performed using the standard protocol following bolus administration of intravenous contrast. RADIATION DOSE REDUCTION: This exam was performed according to the departmental dose-optimization program which includes automated exposure control, adjustment of the mA and/or kV according to patient size and/or use of iterative reconstruction technique. CONTRAST:  OMNIPAQUE IOHEXOL 300 MG/ML  SOLN COMPARISON:  CT abdomen and pelvis 10/19/2015. FINDINGS: Lower chest: There is atelectasis in the lung bases. Hepatobiliary: No focal liver abnormality is seen. No gallstones, gallbladder wall thickening, or biliary dilatation. Pancreas: Unremarkable. No pancreatic ductal dilatation or surrounding inflammatory changes. Spleen: Normal in size without focal abnormality. Adrenals/Urinary Tract: Adrenal glands are unremarkable. Kidneys are normal, without renal calculi, focal lesion, or hydronephrosis. Bladder is unremarkable. Stomach/Bowel: Stomach is within normal limits. Appendix appears normal. No evidence of bowel wall thickening, distention, or  inflammatory changes. Vascular/Lymphatic: No significant vascular findings are present. No enlarged abdominal or pelvic lymph nodes. Reproductive: IUD is in the uterus, possibly low-lying. Right ovary unremarkable. Dominant follicles measuring up to 2 cm noted in the left ovary. Other: No abdominal wall hernia or abnormality. No abdominopelvic ascites. Musculoskeletal: No acute or significant osseous findings. IMPRESSION: 1. No acute localizing process in the abdomen or pelvis. 2. IUD is present which may be low lying. This can be followed up with pelvic ultrasound. Electronically Signed   By: Darliss Cheney M.D.   On: 01/11/2022 21:57   DG Chest Port 1 View  Result Date: 01/11/2022 CLINICAL DATA:  Clinical data EXAM: PORTABLE CHEST 1 VIEW COMPARISON:  None Available. FINDINGS: Normal mediastinum and cardiac silhouette. Normal pulmonary vasculature. No evidence of effusion, infiltrate, or pneumothorax. No acute bony abnormality. IMPRESSION: No acute cardiopulmonary process. Electronically Signed   By: Genevive Bi M.D.   On: 01/11/2022 18:30    Procedures Procedures  {Document cardiac monitor, telemetry assessment procedure when appropriate:1}  Medications Ordered in ED Medications  lactated ringers bolus 1,000 mL (0 mLs Intravenous Stopped 01/11/22 2210)  fentaNYL (SUBLIMAZE) injection 50 mcg (50 mcg Intravenous Given 01/11/22 2055)  iohexol (OMNIPAQUE) 300 MG/ML solution 100 mL (100 mLs Intravenous Contrast Given 01/11/22 2131)  alum & mag hydroxide-simeth (MAALOX/MYLANTA) 200-200-20 MG/5ML suspension 30 mL (30 mLs Oral Given 01/11/22 2226)    And  lidocaine (XYLOCAINE) 2 % viscous mouth solution 15 mL (15 mLs Oral Given 01/11/22 2226)  ED Course/ Medical Decision Making/ A&P                           Medical Decision Making Amount and/or Complexity of Data Reviewed Labs: ordered. Radiology: ordered.  Risk OTC drugs. Prescription drug management.   ***  {Document critical care  time when appropriate:1} {Document review of labs and clinical decision tools ie heart score, Chads2Vasc2 etc:1}  {Document your independent review of radiology images, and any outside records:1} {Document your discussion with family members, caretakers, and with consultants:1} {Document social determinants of health affecting pt's care:1} {Document your decision making why or why not admission, treatments were needed:1} Final Clinical Impression(s) / ED Diagnoses Final diagnoses:  None    Rx / DC Orders ED Discharge Orders          Ordered    pantoprazole (PROTONIX) 20 MG tablet  Daily        01/11/22 2224

## 2022-01-14 NOTE — ED Provider Notes (Signed)
Medical screening examination/treatment/procedure(s) were performed by non-physician practitioner and as supervising physician I was immediately available for consultation/collaboration.  EKG Interpretation  Date/Time:  Friday Jan 11 2022 17:54:12 EDT Ventricular Rate:  81 PR Interval:  128 QRS Duration: 84 QT Interval:  362 QTC Calculation: 420 R Axis:   64 Text Interpretation: Normal sinus rhythm Normal ECG No previous ECGs available normal, no old comparison Confirmed by Arby Barrette [phone]) on 01/11/2022 7:38:51 PM     Arby Barrette, MD 01/14/22 1159

## 2022-01-14 NOTE — ED Provider Notes (Incomplete)
[facility] Provider Note   CSN: [id number] Arrival date & time: 01/11/22  1642     History  Chief Complaint  Patient presents with  . Abdominal Pain  . Chest Pain    Joanna Galvan is a 31 y.o. female   Abdominal Pain Associated symptoms: chest pain   Chest Pain Associated symptoms: abdominal pain       Home Medications Prior to Admission medications   Medication Sig Start Date End Date Taking? Authorizing Provider  pantoprazole (PROTONIX) 20 MG tablet Take 1 tablet (20 mg total) by mouth daily. 01/11/22  Yes Sherrell Puller, PA-C  Ibuprofen (ADVIL) 200 MG CAPS Take 400 mg by mouth as needed.    [provider]  lidocaine (XYLOCAINE) 2 % solution Use as directed 15 mLs in the mouth or throat as needed for mouth pain. 01/10/22   Prosperi, Christian H, PA-C  ondansetron (ZOFRAN) 4 MG tablet Take 1 tablet (4 mg total) by mouth every 6 (six) hours. 01/10/22   Prosperi, Christian H, PA-C      Allergies    Azithromycin, Bupropion, Buspar [buspirone], Cefprozil, and Latex    Review of Systems   Review of Systems  Cardiovascular:  Positive for chest pain.  Gastrointestinal:  Positive for abdominal pain.   Physical Exam Updated Vital Signs BP 110/72   Pulse 67   Temp 97.8 F (36.6 C)   Resp 20   SpO2 98%  Physical Exam  ED Results / Procedures / Treatments   Labs (all labs ordered are listed, but only abnormal results are displayed) Labs Reviewed  BASIC METABOLIC PANEL - Abnormal; Notable for the following components:      Result Value   Glucose, Bld 106 (*)    All other components within normal limits  CBC - Abnormal; Notable for the following components:   WBC 24.7 (*)    RBC 5.19 (*)    All other components within normal limits  URINALYSIS, ROUTINE W REFLEX MICROSCOPIC - Abnormal; Notable for the following components:   Specific Gravity, Urine 1.038 (*)    Glucose, UA 100 (*)    Ketones, ur TRACE (*)    Protein, ur 30 (*)     Leukocytes,Ua SMALL (*)    All other components within normal limits  PREGNANCY, URINE  D-DIMER, QUANTITATIVE  TROPONIN I (HIGH SENSITIVITY)  TROPONIN I (HIGH SENSITIVITY)    EKG EKG Interpretation  Date/Time:  Friday Jan 11 2022 17:54:12 EDT Ventricular Rate:  81 PR Interval:  128 QRS Duration: 84 QT Interval:  362 QTC Calculation: 420 R Axis:   64 Text Interpretation: Normal sinus rhythm Normal ECG No previous ECGs available normal, no old comparison Confirmed by Charlesetta Shanks [id number]) on 01/11/2022 7:38:51 PM  Radiology CT Soft Tissue Neck W Contrast  Result Date: 01/10/2022 CLINICAL DATA:  Epiglottitis or tonsillitis suspected. Sore throat over the last 3 days. Fever and swelling. EXAM: CT NECK WITH CONTRAST TECHNIQUE: Multidetector CT imaging of the neck was performed using the standard protocol following the bolus administration of intravenous contrast. RADIATION DOSE REDUCTION: This exam was performed according to the departmental dose-optimization program which includes automated exposure control, adjustment of the mA and/or kV according to patient size and/or use of iterative reconstruction technique. CONTRAST:  41mL OMNIPAQUE IOHEXOL 300 MG/ML  SOLN COMPARISON:  None FINDINGS: Pharynx and larynx: Bilateral tonsillitis pattern with more swelling on the right than the left. No sign of tonsillar or peritonsillar abscess. Lingual tonsillar tissue is also  markedly enlarged. These changes are presumed to be inflammatory. Follow-up to resolution is suggested to exclude the unlikely possibility of mass lesion. No other mucosal or submucosal finding. Salivary glands: Parotid and submandibular glands are normal. Thyroid: Normal Lymph nodes: Mild bilateral reactive nodal prominence but without evidence of dominant node or suppuration. Vascular: No abnormal vascular finding. Limited intracranial: Normal Visualized orbits: Normal Mastoids and visualized paranasal sinuses: Retention cysts of  the maxillary sinuses. No widespread mucosal thickening or layering fluid. Skeleton: Normal Upper chest: Normal Other: None IMPRESSION: Tonsillar enlargement, including of the lingual tonsillar tissue, right more than left. No evidence of tonsillar or peritonsillar abscess or parapharyngeal space inflammatory disease. Findings are felt likely to be inflammatory. Follow-up to resolution is suggested to exclude the unlikely possibility of neoplastic enlargement. Reactive bilateral cervical chain prominence without dominant node or suppuration. Electronically Signed   By: Nelson Chimes M.D.   On: 01/10/2022 13:54   CT ABDOMEN PELVIS W CONTRAST  Result Date: 01/11/2022 CLINICAL DATA:  Acute abdominal pain. EXAM: CT ABDOMEN AND PELVIS WITH CONTRAST TECHNIQUE: Multidetector CT imaging of the abdomen and pelvis was performed using the standard protocol following bolus administration of intravenous contrast. RADIATION DOSE REDUCTION: This exam was performed according to the departmental dose-optimization program which includes automated exposure control, adjustment of the mA and/or kV according to patient size and/or use of iterative reconstruction technique. CONTRAST:  184mL OMNIPAQUE IOHEXOL 300 MG/ML  SOLN COMPARISON:  CT abdomen and pelvis 10/19/2015. FINDINGS: Lower chest: There is atelectasis in the lung bases. Hepatobiliary: No focal liver abnormality is seen. No gallstones, gallbladder wall thickening, or biliary dilatation. Pancreas: Unremarkable. No pancreatic ductal dilatation or surrounding inflammatory changes. Spleen: Normal in size without focal abnormality. Adrenals/Urinary Tract: Adrenal glands are unremarkable. Kidneys are normal, without renal calculi, focal lesion, or hydronephrosis. Bladder is unremarkable. Stomach/Bowel: Stomach is within normal limits. Appendix appears normal. No evidence of bowel wall thickening, distention, or inflammatory changes. Vascular/Lymphatic: No significant vascular  findings are present. No enlarged abdominal or pelvic lymph nodes. Reproductive: IUD is in the uterus, possibly low-lying. Right ovary unremarkable. Dominant follicles measuring up to 2 cm noted in the left ovary. Other: No abdominal wall hernia or abnormality. No abdominopelvic ascites. Musculoskeletal: No acute or significant osseous findings. IMPRESSION: 1. No acute localizing process in the abdomen or pelvis. 2. IUD is present which may be low lying. This can be followed up with pelvic ultrasound. Electronically Signed   By: Ronney Asters M.D.   On: 01/11/2022 21:57   DG Chest Port 1 View  Result Date: 01/11/2022 CLINICAL DATA:  Clinical data EXAM: PORTABLE CHEST 1 VIEW COMPARISON:  None Available. FINDINGS: Normal mediastinum and cardiac silhouette. Normal pulmonary vasculature. No evidence of effusion, infiltrate, or pneumothorax. No acute bony abnormality. IMPRESSION: No acute cardiopulmonary process. Electronically Signed   By: Suzy Bouchard M.D.   On: 01/11/2022 18:30    Procedures Procedures  {Document cardiac monitor, telemetry assessment procedure when appropriate:1}  Medications Ordered in ED Medications  lactated ringers bolus 1,000 mL (0 mLs Intravenous Stopped 01/11/22 2210)  fentaNYL (SUBLIMAZE) injection 50 mcg (50 mcg Intravenous Given 01/11/22 2055)  iohexol (OMNIPAQUE) 300 MG/ML solution 100 mL (100 mLs Intravenous Contrast Given 01/11/22 2131)  alum & mag hydroxide-simeth (MAALOX/MYLANTA) 200-200-20 MG/5ML suspension 30 mL (30 mLs Oral Given 01/11/22 2226)    And  lidocaine (XYLOCAINE) 2 % viscous mouth solution 15 mL (15 mLs Oral Given 01/11/22 2226)    ED Course/ Medical Decision Making/ A&P  Medical Decision Making Amount and/or Complexity of Data Reviewed Labs: ordered. Radiology: ordered.  Risk OTC drugs. Prescription drug management.   ***  {Document critical care time when appropriate:1} {Document review of labs and clinical  decision tools ie heart score, Chads2Vasc2 etc:1}  {Document your independent review of radiology images, and any outside records:1} {Document your discussion with family members, caretakers, and with consultants:1} {Document social determinants of health affecting pt's care:1} {Document your decision making why or why not admission, treatments were needed:1} Final Clinical Impression(s) / ED Diagnoses Final diagnoses:  None    Rx / DC Orders ED Discharge Orders          Ordered    pantoprazole (PROTONIX) 20 MG tablet  Daily        01/11/22 2224

## 2022-01-23 NOTE — Progress Notes (Signed)
Joanna Galvan is a 31 y.o. female here for a follow up of a pre-existing problem.  History of Present Illness:   Chief Complaint  Patient presents with   Follow-up    Pt is here for ED f/u was seen on 01/11/22.   Cough    Pt is c/o persistent cough, expectorating green sputum, Fatigue, SOB with exertion x 1 week. Pt was treated in ED with IV antibiotics and shot.    HPI  ED follow-up Patient presented to ED on 01/11/2022 with lower chest and upper abdominal pain which was gradually getting worse. She was seen in the ED about a day before on 01/10/2022 for sore throat. She was given Decadron as well as Bicillin, fluids, Tylenol, and Zofran for this issue. States her sore throat got better at that time. Had work up in the ED including CT scan of abdomen and chest x-ray which was normal. EKG was normal was normal as well. Had lab work including CBC which showed elevated white blood cell count at 24.7. They though this could be due to the Decadron she was given. Urinalysis showed some sign of dehydration. During the ED visit, she was given 1 L of LR and fentanyl and a GI cocktail containing Maalox and lidocaine. She was discharged home to follow up with PCP.   Patient states she has not been doing well since being home. She complain of persistent cough, coughing up some green sputum. Her associated symptoms include fatigue, body aches and shortness of breath with exertion. States she has not been feeling well even after taking Tynelol and Ibuprofen. She has tried Mucinex with no relief. Denies chest pain, fever or chills, LE swelling. Has had sore throat couple of days ago. No other known sick contacts.   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 BIRTHING SUITES;  Service: Obstetrics;  Laterality: N/A;  Odelia Gage, RNFA   TONSILLECTOMY      Family History  Problem Relation Age of Onset   Diabetes Mother    Irritable bowel syndrome Mother    Anxiety disorder Father    ADD / ADHD Father    Depression Sister    Irritable bowel syndrome Sister    COPD Maternal Grandmother    Diabetes Maternal Grandmother    Cancer Maternal Grandmother        breast   COPD Paternal Grandmother    Lung cancer Paternal Grandmother     Allergies  Allergen Reactions   Azithromycin Hives   Bupropion Hives  Buspar [Buspirone]    Cefprozil Hives   Latex Itching and Swelling    Current Medications:   Current Outpatient Medications:    amoxicillin-clavulanate (AUGMENTIN) 875-125 MG tablet, Take 1 tablet by mouth 2 (two) times daily., Disp: 20 tablet, Rfl: 0   doxycycline (VIBRA-TABS) 100 MG tablet, Take 1 tablet (100 mg total) by mouth 2 (two) times daily., Disp: 20 tablet, Rfl: 0   Ibuprofen (ADVIL) 200 MG CAPS, Take 400 mg by mouth as needed., Disp: , Rfl:    ondansetron (ZOFRAN) 4 MG tablet, Take 1 tablet (4 mg total) by mouth every 6 (six) hours., Disp: 12 tablet, Rfl: 0   lidocaine (XYLOCAINE) 2 % solution, Use as directed 15 mLs in the mouth or throat as needed for mouth pain. (Patient not taking: Reported on 01/24/2022), Disp: 100 mL, Rfl: 0   pantoprazole (PROTONIX) 20 MG tablet, Take 1 tablet (20 mg  total) by mouth daily. (Patient not taking: Reported on 01/24/2022), Disp: 14 tablet, Rfl: 0   Review of Systems:   ROS Negative unless otherwise specified per HPI.   Vitals:   Vitals:   01/24/22 0859  BP: 130/84  Pulse: 72  Temp: 97.7 F (36.5 C)  TempSrc: Temporal  SpO2: 97%  Weight: 190 lb 6.1 oz (86.4 kg)  Height: 5\' 3"  (1.6 m)     Body mass index is 33.72 kg/m.  Physical Exam:   Physical Exam Vitals and nursing note reviewed.  Constitutional:      General: She is not in acute distress.    Appearance: She is well-developed. She is not ill-appearing or toxic-appearing.  HENT:     Head: Normocephalic and atraumatic.     Right Ear: Tympanic membrane, ear canal and external ear normal. Tympanic membrane is not erythematous, retracted or bulging.     Left Ear: Tympanic membrane, ear canal and external ear normal. Tympanic membrane is not erythematous, retracted or bulging.     Nose: Nose normal.     Right Sinus: No maxillary sinus tenderness or frontal sinus tenderness.     Left Sinus: No maxillary sinus tenderness or frontal sinus tenderness.     Mouth/Throat:     Pharynx: Uvula midline. No posterior oropharyngeal erythema.  Eyes:     General: Lids are normal.     Conjunctiva/sclera: Conjunctivae normal.  Neck:     Trachea: Trachea normal.  Cardiovascular:     Rate and Rhythm: Normal rate and regular rhythm.     Pulses: Normal pulses.     Heart sounds: Normal heart sounds, S1 normal and S2 normal.  Pulmonary:     Effort: Pulmonary effort is normal.     Breath sounds: Normal breath sounds. No decreased breath sounds, wheezing, rhonchi or rales.  Lymphadenopathy:     Cervical: No cervical adenopathy.  Skin:    General: Skin is warm and dry.  Neurological:     Mental Status: She is alert.     GCS: GCS eye subscore is 4. GCS verbal subscore is 5. GCS motor subscore is 6.  Psychiatric:        Speech: Speech normal.        Behavior: Behavior normal. Behavior is  cooperative.     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 so she was advised re: allergic reaction with augmentin and verbalized understanding to this (she states that she feels as though she  has had augmentin in the past and tolerated well) 80 mg depomedrol given Update blood work today She would like to opt for treatment before obtaining xray -- xray was ordered and she can get this early next week if feeling bad, sooner if concerns Any worsening sx, she was instructed to go to the ER Wells Score is 0  I,Savera Zaman,acting as a Neurosurgeonscribe for [employer] CorporationSamantha Aailyah Dunbar, PA.,have documented all relevant documentation on the behalf of Jarold MottoSamantha Wendell Nicoson, PA,as directed by  Jarold MottoSamantha Rorik Vespa, PA while in the presence of Jarold MottoSamantha Sarajean Dessert, [place].   I, Jarold MottoSamantha Kevyn Wengert, [place], have reviewed all documentation for this visit. The documentation on 01/24/22 for the exam, diagnosis, procedures, and orders are all accurate and complete.   Jarold MottoSamantha Sharmain Lastra, PA-C

## 2022-01-24 ENCOUNTER — Encounter: Payer: Self-pay | Admitting: Physician Assistant

## 2022-01-24 ENCOUNTER — Ambulatory Visit: Payer: 59 | Admitting: Physician Assistant

## 2022-01-24 VITALS — BP 130/84 | HR 72 | Temp 97.7°F | Ht 63.0 in | Wt 190.4 lb

## 2022-01-24 DIAGNOSIS — R052 Subacute cough: Secondary | ICD-10-CM

## 2022-01-24 LAB — COMPREHENSIVE METABOLIC PANEL
ALT: 21 U/L (ref 0–35)
AST: 16 U/L (ref 0–37)
Albumin: 4.2 g/dL (ref 3.5–5.2)
Alkaline Phosphatase: 61 U/L (ref 39–117)
BUN: 9 mg/dL (ref 6–23)
CO2: 28 mEq/L (ref 19–32)
Calcium: 9.7 mg/dL (ref 8.4–10.5)
Chloride: 103 mEq/L (ref 96–112)
Creatinine, Ser: 0.73 mg/dL (ref 0.40–1.20)
GFR: 110.5 mL/min (ref >60.00)
Glucose, Bld: 102 mg/dL — ABNORMAL HIGH (ref 70–99)
Potassium: 4.8 mEq/L (ref 3.5–5.1)
Sodium: 139 mEq/L (ref 135–145)
Total Bilirubin: 0.6 mg/dL (ref 0.2–1.2)
Total Protein: 7.2 g/dL (ref 6.0–8.3)

## 2022-01-24 LAB — CBC WITH DIFFERENTIAL/PLATELET
Basophils Absolute: 0.1 10*3/uL (ref 0.0–0.1)
Basophils Relative: 0.6 % (ref 0.0–3.0)
Eosinophils Absolute: 0.2 10*3/uL (ref 0.0–0.7)
Eosinophils Relative: 2.5 % (ref 0.0–5.0)
HCT: 39.5 % (ref 36.0–46.0)
Hemoglobin: 12.8 g/dL (ref 12.0–15.0)
Lymphocytes Relative: 30.8 % (ref 12.0–46.0)
Lymphs Abs: 3 10*3/uL (ref 0.7–4.0)
MCHC: 32.3 g/dL (ref 30.0–36.0)
MCV: 81.1 fl (ref 78.0–100.0)
Monocytes Absolute: 0.6 10*3/uL (ref 0.1–1.0)
Monocytes Relative: 6.3 % (ref 3.0–12.0)
Neutro Abs: 5.8 10*3/uL (ref 1.4–7.7)
Neutrophils Relative %: 59.8 % (ref 43.0–77.0)
Platelets: 307 10*3/uL (ref 150.0–400.0)
RBC: 4.86 Mil/uL (ref 3.87–5.11)
RDW: 13.8 % (ref 11.5–15.5)
WBC: 9.6 10*3/uL (ref 4.0–10.5)

## 2022-01-24 LAB — SEDIMENTATION RATE: Sed Rate: 38 mm/hr — ABNORMAL HIGH (ref 0–20)

## 2022-01-24 LAB — C-REACTIVE PROTEIN: CRP: 1.7 mg/dL (ref 0.5–20.0)

## 2022-01-24 MED ORDER — METHYLPREDNISOLONE ACETATE 80 MG/ML IJ SUSP
80.0000 mg | Freq: Once | INTRAMUSCULAR | Status: AC
  Administered 2022-01-24: 80 mg via INTRAMUSCULAR

## 2022-01-24 MED ORDER — DOXYCYCLINE HYCLATE 100 MG PO TABS: 100.0000 mg | ORAL_TABLET | Freq: Two times a day (BID) | ORAL | 0 refills | Status: DC

## 2022-01-24 MED ORDER — AMOXICILLIN-POT CLAVULANATE 875-125 MG PO TABS: 1.0000 | ORAL_TABLET | Freq: Two times a day (BID) | ORAL | 0 refills | Status: DC

## 2022-01-24 NOTE — Patient Instructions (Addendum)
It was great to see you!  Start both antibiotics Update blood work today Steroid shot today  An order for an xray has been put in for you. To get your xray, you can walk in at the [facility] location without a scheduled appointment.  The address is [address] It is across the street from [facility]. X-ray is located in the basement.  Hours of operation are M-F 8:30am to 5:00pm. Please note that they are closed for lunch between 12:30 and 1:00pm.  Any worsening, please go to the ER.  Take care,  Jarold Motto PA-C

## 2022-04-23 ENCOUNTER — Ambulatory Visit: Payer: 59 | Admitting: Family

## 2022-04-23 ENCOUNTER — Encounter: Payer: Self-pay | Admitting: Family

## 2022-04-23 VITALS — BP 110/78 | HR 86 | Temp 98.2°F | Ht 63.0 in | Wt 196.0 lb

## 2022-04-23 DIAGNOSIS — J069 Acute upper respiratory infection, unspecified: Secondary | ICD-10-CM

## 2022-04-23 DIAGNOSIS — R519 Headache, unspecified: Secondary | ICD-10-CM

## 2022-04-23 MED ORDER — NAPROXEN 500 MG PO TABS: 500.0000 mg | ORAL_TABLET | Freq: Two times a day (BID) | ORAL | 0 refills | Status: DC

## 2022-04-23 MED ORDER — BENZONATATE 200 MG PO CAPS: 200.0000 mg | ORAL_CAPSULE | Freq: Three times a day (TID) | ORAL | 0 refills | Status: AC | PRN

## 2022-04-23 NOTE — Patient Instructions (Addendum)
It was very nice to see you today!   Use your inhaler prior to bedtime and in the mornings for the next few days to help reduce your cough symptoms. I am also sending over Tessalon pearles to help your cough. I have sent Naproxen to help your body aches, headache, and any fever, take this twice a day for next few days. You can take over the counter generic Sudafed 2 times per day to help with nasal congestion and nasal drainage.  Drink plenty of fluids!!       PLEASE NOTE:  If you had any lab tests please let us know if you have not heard back within a few days. You may see your results on MyChart before we have a chance to review them but we will give you a call once they are reviewed by [place]. If we ordered any referrals today, please let us know if you have not heard from their office within the next week.

## 2022-04-23 NOTE — Progress Notes (Signed)
Patient ID: Joanna Galvan, female    DOB: Aug 04, 1991, 31 y.o.   MRN: [id number]  Chief Complaint  Patient presents with   Generalized Body Aches    Symptoms started 1x week ago and since have progressively got worse.    Torticollis   Nasal Congestion   Cough   Migraine    HPI:      Upper Respiratory Infection: Symptoms include  body aches, fever in beginning, non-productive cough, headaches, neck stiffness, nasal congestion & drainage, pleuritic chest pain .  Onset of symptoms was 1 week ago, unchanged since that time. She is drinking moderate amounts of fluids. Evaluation to date: none.  Treatment to date: Tylenol & Mucinex. Pt reports back of head/neck pain, no fever for last 2 days.      Assessment & Plan:  1. Persistent headaches sending Naproxen, advised on use & SE, will also help body aches, pleuritic CP.  - naproxen (NAPROSYN) 500 MG tablet; Take 1 tablet (500 mg total) by mouth 2 (two) times daily with a meal.  Dispense: 20 tablet; Refill: 0  2. Viral upper respiratory tract infection sending Tessalon pearles, advised on use & SE, can take OTC generic Sudafed for sinus sx, drink at least 2L of water qd.  - benzonatate (TESSALON) 200 MG capsule; Take 1 capsule (200 mg total) by mouth 3 (three) times daily as needed for up to 10 days for cough.  Dispense: 30 capsule; Refill: 0   Subjective:    Outpatient Medications Prior to Visit  Medication Sig Dispense Refill   albuterol (VENTOLIN HFA) 108 (90 Base) MCG/ACT inhaler INHALE 2 PUFFS EVERY SIX (6) HOURS AS NEEDED FOR WHEEZING.     amoxicillin-clavulanate (AUGMENTIN) 875-125 MG tablet Take 1 tablet by mouth 2 (two) times daily. 20 tablet 0   doxycycline (VIBRA-TABS) 100 MG tablet Take 1 tablet (100 mg total) by mouth 2 (two) times daily. 20 tablet 0   Ibuprofen (ADVIL) 200 MG CAPS Take 400 mg by mouth as needed.     lidocaine (XYLOCAINE) 2 % solution Use as directed 15 mLs in the mouth or throat as needed for mouth pain.  (Patient not taking: Reported on 01/24/2022) 100 mL 0   ondansetron (ZOFRAN) 4 MG tablet Take 1 tablet (4 mg total) by mouth every 6 (six) hours. 12 tablet 0   pantoprazole (PROTONIX) 20 MG tablet Take 1 tablet (20 mg total) by mouth daily. (Patient not taking: Reported on 01/24/2022) 14 tablet 0   No facility-administered medications prior to visit.   Past Medical History:  Diagnosis Date   ADHD (attention deficit hyperactivity disorder)    Anxiety    Asthma    well controlled; bronchitis easily   Depression    GERD (gastroesophageal reflux disease)    H pylori history   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) [facility])    Past Surgical History:  Procedure Laterality Date   CESAREAN SECTION N/A 11/26/2016   Procedure: CESAREAN SECTION;  Surgeon: Huel Cote, MD;  Location: [facility] BIRTHING SUITES;  Service: Obstetrics;  Laterality: N/A;  Heather K, RNFA   TONSILLECTOMY     Allergies  Allergen Reactions   Azithromycin Hives   Bupropion Hives   Buspar [Buspirone]    Cefprozil Hives   Latex Itching and Swelling      Objective:    Physical Exam Vitals and nursing note reviewed.  Constitutional:      Appearance: Normal appearance.  HENT:     Right Ear: Tympanic membrane  and ear canal normal.     Left Ear: Tympanic membrane and ear canal normal.     Nose:     Right Sinus: No frontal sinus tenderness.     Left Sinus: No frontal sinus tenderness.     Mouth/Throat:     Mouth: Mucous membranes are moist.     Pharynx: No pharyngeal swelling, oropharyngeal exudate, posterior oropharyngeal erythema or uvula swelling.  Cardiovascular:     Rate and Rhythm: Normal rate and regular rhythm.  Pulmonary:     Effort: Pulmonary effort is normal.     Breath sounds: Normal breath sounds.  Musculoskeletal:        General: Normal range of motion.  Lymphadenopathy:     Head:     Right side of head: No preauricular or posterior auricular adenopathy.     Left side of head: No preauricular or  posterior auricular adenopathy.     Cervical: No cervical adenopathy.  Skin:    General: Skin is warm and dry.  Neurological:     Mental Status: She is alert.  Psychiatric:        Mood and Affect: Mood normal.        Behavior: Behavior normal.    BP 110/78   Pulse 86   Temp 98.2 F (36.8 C)   Ht 5\' 3"  (1.6 m)   Wt 196 lb (88.9 kg)   SpO2 97%   BMI 34.72 kg/m  Wt Readings from Last 3 Encounters:  04/23/22 196 lb (88.9 kg)  01/24/22 190 lb 6.1 oz (86.4 kg)  01/10/22 185 lb (83.9 kg)       01/12/22, NP

## 2022-04-24 ENCOUNTER — Ambulatory Visit: Payer: 59 | Admitting: Physician Assistant

## 2022-07-25 ENCOUNTER — Telehealth (INDEPENDENT_AMBULATORY_CARE_PROVIDER_SITE_OTHER): Payer: Self-pay | Admitting: Family

## 2022-07-25 ENCOUNTER — Encounter: Payer: Self-pay | Admitting: Family

## 2022-07-25 VITALS — Ht 63.0 in | Wt 190.0 lb

## 2022-07-25 DIAGNOSIS — K529 Noninfective gastroenteritis and colitis, unspecified: Secondary | ICD-10-CM

## 2022-07-25 MED ORDER — ONDANSETRON 4 MG PO TBDP: 4.0000 mg | ORAL_TABLET | Freq: Three times a day (TID) | ORAL | 0 refills | Status: AC | PRN

## 2022-07-25 MED ORDER — DIPHENOXYLATE-ATROPINE 2.5-0.025 MG PO TABS: 1.0000 | ORAL_TABLET | Freq: Four times a day (QID) | ORAL | 0 refills | Status: AC | PRN

## 2022-07-25 NOTE — Progress Notes (Signed)
MyChart Video Visit    Virtual Visit via Video Note   This format is felt to be most appropriate for this patient at this time. Physical exam was limited by quality of the video and audio technology used for the visit. CMA was able to get the patient set up on a video visit.  Patient location: Home. Patient and provider in visit Provider location: Office  I discussed the limitations of evaluation and management by telemedicine and the availability of in person appointments. The patient expressed understanding and agreed to proceed.  Visit Date: 07/25/2022  Today's healthcare provider: Dulce Sellar, NP     Subjective:   Patient ID: Joanna Galvan, female    DOB: 07-29-1991, 31 y.o.   MRN: [id number]  Chief Complaint  Patient presents with   Abdominal Pain    Pt c/o abdominal pain, vomitng and diarrhea since Tuesday.  Has tried Pepto bismol, dramamine and immodium which did not help symptoms. RSV last week.     HPI Abd sx:  had similar sx last week w/RSV w/her son, then felt better for 2d, but then sx returned, having hard time keeping fluids down, reports watery stools twice an hour since Tuesday, taking OTC Imodium which is not helping. States she feels weak, but has not passed out.  Assessment & Plan:   Problem List Items Addressed This Visit   None Visit Diagnoses     Gastroenteritis    -  Primary sending Zofran & Lomotil, advised on use & SE, importance of hydration, if still unable to keep fluids down by tomorrow go to the ER. Advised on BRAT diet, start with small amounts of food at a time.    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     levonorgestrel (MIRENA) 20 MCG/DAY IUD 1 each by Intrauterine route once. 5 years     naproxen (NAPROSYN) 500 MG tablet Take 1 tablet (500 mg total) by mouth 2 (two) times daily with a meal. 20 tablet 0   No facility-administered medications prior to visit.    Allergies  Allergen Reactions   Azithromycin Hives   Bupropion Hives   Buspar [Buspirone]    Cefprozil Hives   Latex Itching and Swelling       Objective:   Physical Exam Vitals and nursing note reviewed.  Constitutional:      General: Pt is not in acute distress.    Appearance: Normal appearance.  HENT:     Head: Normocephalic.  Pulmonary:     Effort: No respiratory distress.  Musculoskeletal:     Cervical back: Normal range of motion.  Skin:  General: Skin is dry.     Coloration: Skin is not pale.  Neurological:     Mental Status: Pt is alert and oriented to person, place, and time.  Psychiatric:        Mood and Affect: Mood normal.   Ht 5\' 3"  (1.6 m)   Wt 190 lb (86.2 kg)   BMI 33.66 kg/m   Wt Readings from Last 3 Encounters:  07/25/22 190 lb (86.2 kg)  04/23/22 196 lb (88.9 kg)  01/24/22 190 lb 6.1 oz (86.4 kg)       I discussed the assessment and treatment plan with the patient. The patient was provided an opportunity to ask questions and all were answered. The patient agreed with the plan and demonstrated an understanding of the instructions.   The patient was advised to call back or seek an in-person  evaluation if the symptoms worsen or if the condition fails to improve as anticipated.  03/26/22, NP [facility] [place] [phone] (phone) [phone] (fax)  [facility]

## 2022-10-08 ENCOUNTER — Other Ambulatory Visit: Payer: Self-pay

## 2022-10-08 ENCOUNTER — Emergency Department (HOSPITAL_BASED_OUTPATIENT_CLINIC_OR_DEPARTMENT_OTHER)
Admission: EM | Admit: 2022-10-08 | Discharge: 2022-10-08 | Disposition: A | Discharge status: Home / Self Care | Payer: Self-pay | Attending: Emergency Medicine | Admitting: Emergency Medicine

## 2022-10-08 ENCOUNTER — Encounter (HOSPITAL_BASED_OUTPATIENT_CLINIC_OR_DEPARTMENT_OTHER): Payer: Self-pay | Admitting: Emergency Medicine

## 2022-10-08 DIAGNOSIS — J020 Streptococcal pharyngitis: Secondary | ICD-10-CM | POA: Insufficient documentation

## 2022-10-08 DIAGNOSIS — J45909 Unspecified asthma, uncomplicated: Secondary | ICD-10-CM | POA: Insufficient documentation

## 2022-10-08 DIAGNOSIS — Z1152 Encounter for screening for COVID-19: Secondary | ICD-10-CM | POA: Insufficient documentation

## 2022-10-08 LAB — RESP PANEL BY RT-PCR (RSV, FLU A&B, COVID)  RVPGX2
Influenza A by PCR: NEGATIVE
Influenza B by PCR: NEGATIVE
Resp Syncytial Virus by PCR: NEGATIVE
SARS Coronavirus 2 by RT PCR: NEGATIVE

## 2022-10-08 LAB — GROUP A STREP BY PCR: Group A Strep by PCR: DETECTED — AB

## 2022-10-08 MED ORDER — ALUM & MAG HYDROXIDE-SIMETH 200-200-20 MG/5ML PO SUSP
30.0000 mL | Freq: Once | ORAL | Status: AC
  Administered 2022-10-08: 30 mL via ORAL
  Filled 2022-10-08: qty 30

## 2022-10-08 MED ORDER — LIDOCAINE VISCOUS HCL 2 % MT SOLN
15.0000 mL | Freq: Once | OROMUCOSAL | Status: AC
  Administered 2022-10-08: 15 mL via ORAL
  Filled 2022-10-08: qty 15

## 2022-10-08 MED ORDER — PENICILLIN G BENZATHINE 1200000 UNIT/2ML IM SUSY
1.2000 10*6.[IU] | PREFILLED_SYRINGE | Freq: Once | INTRAMUSCULAR | Status: AC
  Administered 2022-10-08: 1.2 10*6.[IU] via INTRAMUSCULAR
  Filled 2022-10-08: qty 2

## 2022-10-08 NOTE — ED Provider Notes (Signed)
[facility] Provider Note   CSN: [id number] Arrival date & time: 10/08/22  0908     History  Chief Complaint  Patient presents with   Sore Throat    Joanna Galvan is a 32 y.o. female.  32 year old female with past medical history of asthma,Depression presents to the ED with a chief complaint of sore throat x yesterday.  Patient describes the pain in her throat as the feeling of swallowing glass.  There is no alleviating or exacerbating factors.  Now she feels like she is not tolerating food.  Her last meal was chicken tenders around lunch yesterday.  Has been taking ibuprofen due to a fever with a Tmax of 103.4, however no improvement in symptoms.  She is also endorsing some body aches, feeling like she cannot catch her breath due to the pain.  Unknown vaccination status.  No chest pain, no abdominal pain, no other complaints.  The history is provided by the patient and medical records.  Sore Throat       Home Medications Prior to Admission medications   Medication Sig Start Date End Date Taking? Authorizing Provider  albuterol (VENTOLIN HFA) 108 (90 Base) MCG/ACT inhaler INHALE 2 PUFFS EVERY SIX (6) HOURS AS NEEDED FOR WHEEZING.    [provider]  diphenoxylate-atropine (LOMOTIL) 2.5-0.025 MG tablet Take 1 tablet by mouth 4 (four) times daily as needed for diarrhea or loose stools. 07/25/22   Jeanie Sewer, NP  levonorgestrel (MIRENA) 20 MCG/DAY IUD 1 each by Intrauterine route once. 5 years    [provider]  ondansetron (ZOFRAN-ODT) 4 MG disintegrating tablet Take 1 tablet (4 mg total) by mouth every 8 (eight) hours as needed for nausea or vomiting. 07/25/22   Jeanie Sewer, NP      Allergies    Azithromycin, Bupropion, Buspar [buspirone], Cefprozil, and Latex    Review of Systems   Review of Systems  Constitutional:  Positive for fever.  HENT:  Positive for sore throat.     Physical Exam Updated  Vital Signs BP 112/84 (BP Location: Right Arm)   Pulse (!) 111   Temp 98.6 F (37 C)   Resp 18   Ht '5\' 2"'$  (1.575 m)   Wt 83.9 kg   LMP 10/01/2022 (Approximate)   SpO2 99%   BMI 33.84 kg/m  Physical Exam Vitals and nursing note reviewed.  Constitutional:      Appearance: She is well-developed.  HENT:     Head: Normocephalic and atraumatic.     Mouth/Throat:     Mouth: Mucous membranes are moist.     Pharynx: Posterior oropharyngeal erythema present.     Tonsils: 0 on the right. 0 on the left.     Comments: Prior tonsillectomy noted.  Significant erythema along with petechial spots through the entire oropharynx. Cardiovascular:     Rate and Rhythm: Tachycardia present.  Pulmonary:     Effort: Pulmonary effort is normal.     Breath sounds: No wheezing.  Abdominal:     Palpations: Abdomen is soft.  Musculoskeletal:     Cervical back: Normal range of motion and neck supple.  Lymphadenopathy:     Cervical: No cervical adenopathy.  Skin:    General: Skin is warm and dry.  Neurological:     Mental Status: She is alert and oriented to person, place, and time.     ED Results / Procedures / Treatments   Labs (all labs ordered are listed, but only  abnormal results are displayed) Labs Reviewed  GROUP A STREP BY PCR - Abnormal; Notable for the following components:      Result Value   Group A Strep by PCR DETECTED (*)    All other components within normal limits  RESP PANEL BY RT-PCR (RSV, FLU A&B, COVID)  [id number]    EKG None  Radiology No results found.  Procedures Procedures    Medications Ordered in ED Medications  penicillin g benzathine (BICILLIN LA) 1200000 UNIT/2ML injection 1.2 Million Units (has no administration in time range)  alum & mag hydroxide-simeth (MAALOX/MYLANTA) 200-200-20 MG/5ML suspension 30 mL (30 mLs Oral Given 10/08/22 1023)    And  lidocaine (XYLOCAINE) 2 % viscous mouth solution 15 mL (15 mLs Oral Given 10/08/22 1023)    ED Course/  Medical Decision Making/ A&P Clinical Course as of 10/08/22 1043  Tue Oct 08, 2022  1007 Group A Strep by PCR(!): DETECTED [JS]    Clinical Course User Index [JS] Janeece Fitting, PA-C                             Medical Decision Making Amount and/or Complexity of Data Reviewed Labs:  Decision-making details documented in ED Course.  Risk OTC drugs. Prescription drug management.    Patient presents to the ED with a chief complaint of sore throat that began yesterday, describes her throat as like she is swallowing glass.  On exam she is overall ill-appearing, heart rate is slightly elevated at 111 but she is afebrile, has been taking ibuprofen for symptoms.  On exam oropharynx with significant erythema, prominent tonsillectomy.  She does have any cervical lymphadenopathy.  No rhinorrhea or congestion noted.  Her lungs are clear to auscultation without any wheezing, rhonchi or rales.  Rapid strep by PCR was positive for strep a.  Given Maalox for symptomatic treatment.  Will discuss treatment with the Bicillin.  Strep pharyngitis test is positive today.  I discussed with patient importance of treatment, she denies any prior allergy to Bicillin as she is received this in the past.  Bicillin has been ordered for patient.  We discussed continue to treat her fever at home with Tylenol versus ibuprofen.  Patient is hemodynamically stable for discharge.  Portions of this note were generated with [employer]. Dictation errors may occur despite best attempts at proofreading.   Final Clinical Impression(s) / ED Diagnoses Final diagnoses:  Strep pharyngitis    Rx / DC Orders ED Discharge Orders     None         Janeece Fitting, PA-C 10/08/22 1043    Pattricia Boss, MD 10/14/22 [phone]

## 2022-10-08 NOTE — Discharge Instructions (Signed)
You will receive an injection today in order to help treat your strep pharyngitis.  You should begin to feel better within the next 2 days.  You may continue treating your fever with Tylenol and donating with ibuprofen.  If you experience any worsening symptoms please return to the emergency department.

## 2022-10-08 NOTE — ED Triage Notes (Signed)
Pt c/ sore throat, fever since yesterday, denies cough. Pt reports 103.4 F at last night, Ibuprofen last taken 2200 last night. Afebrile at present.

## 2022-10-08 NOTE — ED Notes (Signed)
Pt given discharge instructions. Opportunities given for questions. Pt verbalizes understanding. Leanne Chang, RN

## 2023-03-25 IMAGING — CT CT NECK W/ CM
3 of 5 series · 12 of 35 positions shown, 14 images · IV contrast (agent unspecified)
Comparison: None

CLINICAL DATA: Epiglottitis or tonsillitis suspected. Sore throat
over the last 3 days. Fever and swelling.

EXAM:
CT NECK WITH CONTRAST
TECHNIQUE: Multidetector CT imaging of the neck was performed using the
standard protocol following the bolus administration of intravenous
contrast.

[Series 4: cor neck · coronal · 0.35mm/px · 3 of 102 slices shown]
[im 35/102  bone]
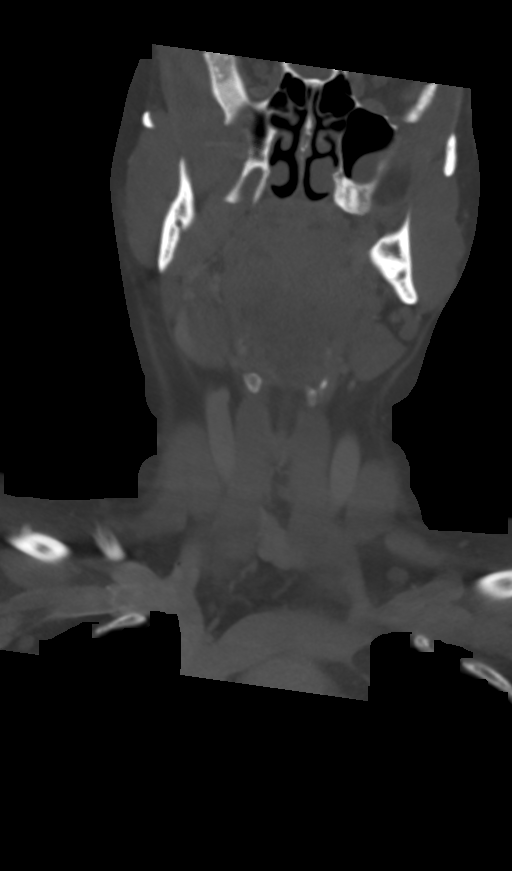
[im 46/102  bone]
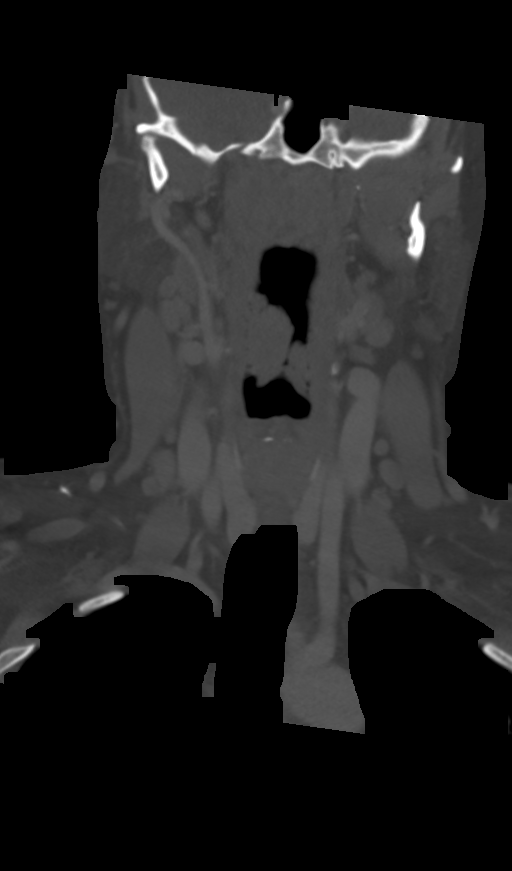
[im 56/102  bone]
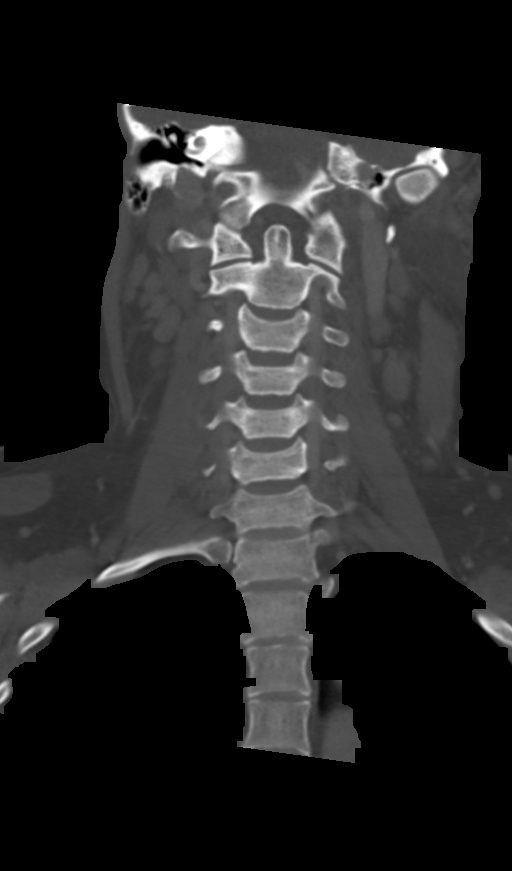

[Series 5: sag neck · sagittal · 0.43mm/px · 5 of 96 slices shown, 6 images]
[im 32/96  bone]
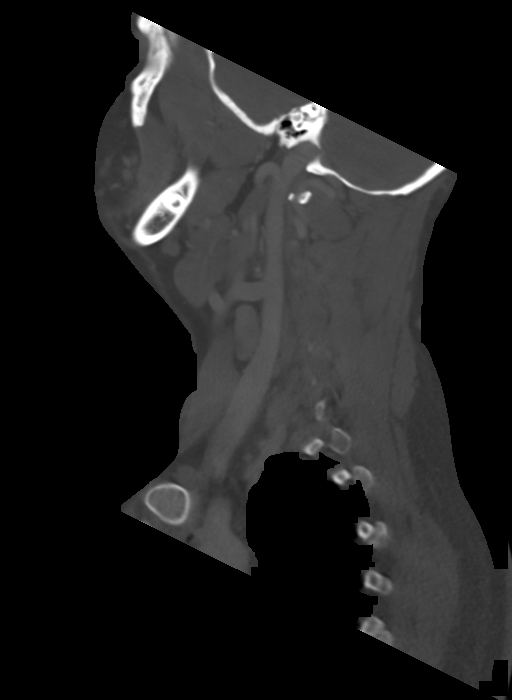
[im 40/96  bone]
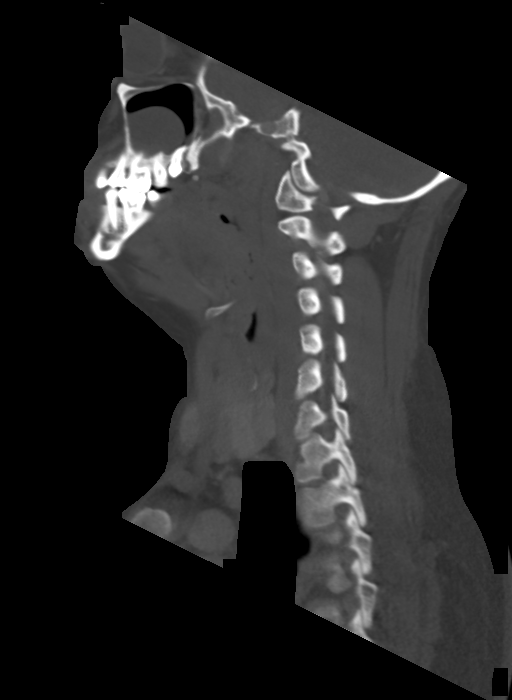
[im 48/96  soft-tissue]
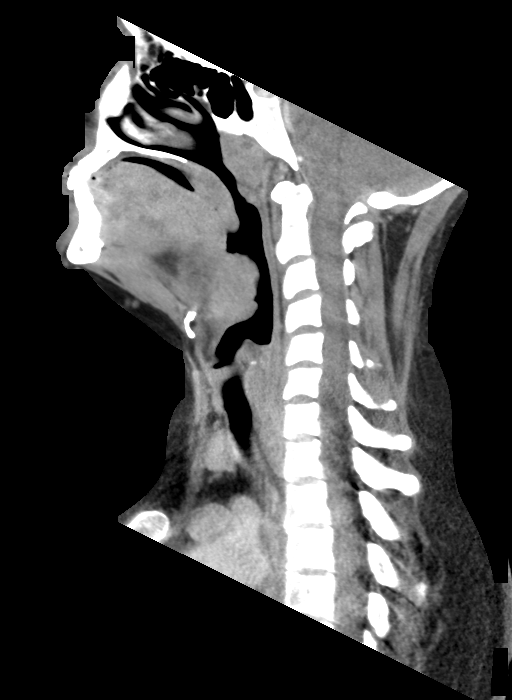
[im 48/96  bone]
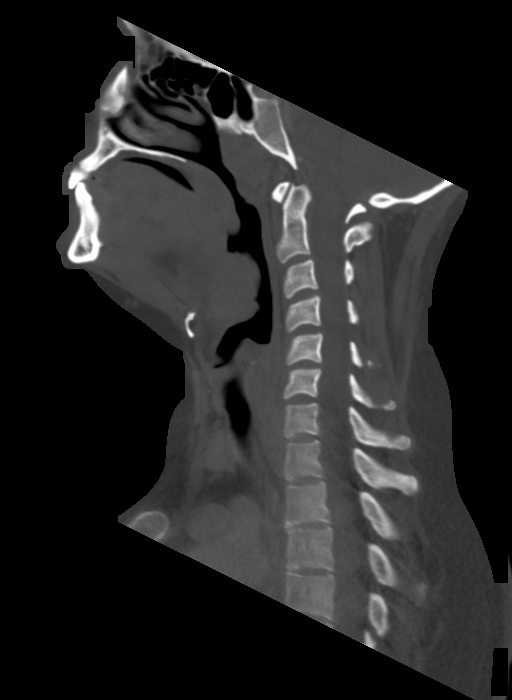
[im 56/96  bone]
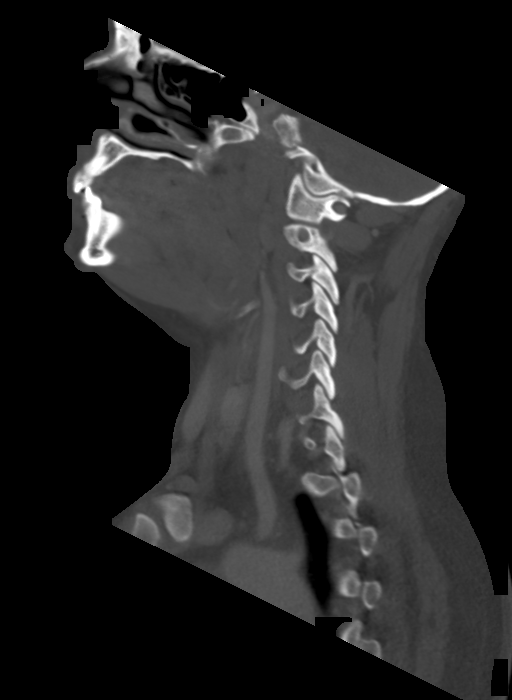
[im 64/96  bone]
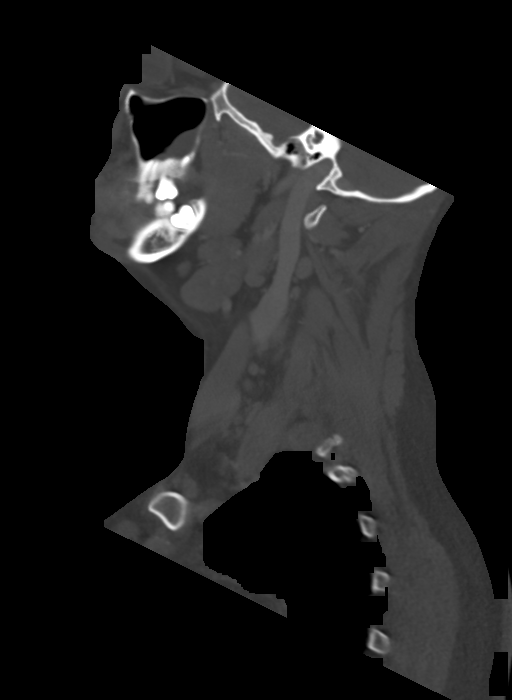

[Series 6: ax oropharynx · axial · 0.43mm/px · z∈[+487,+650]mm · 4 of 159 slices shown, 5 images]
[im 32/159  soft-tissue]
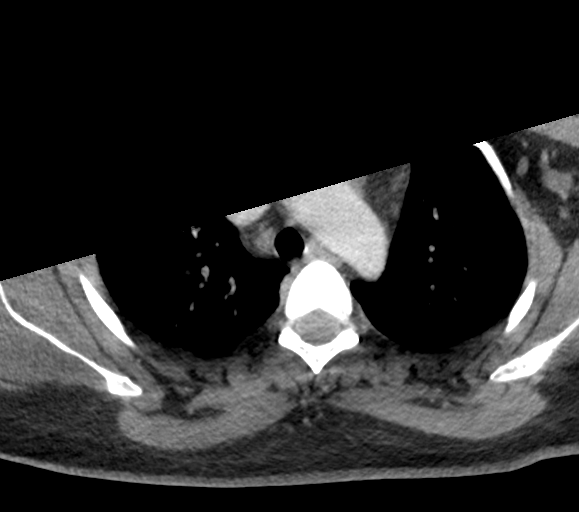
[im 32/159  bone]
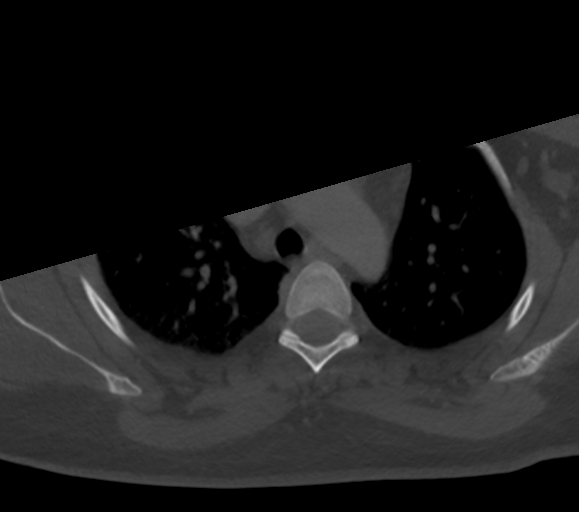
[im 64/159  bone]
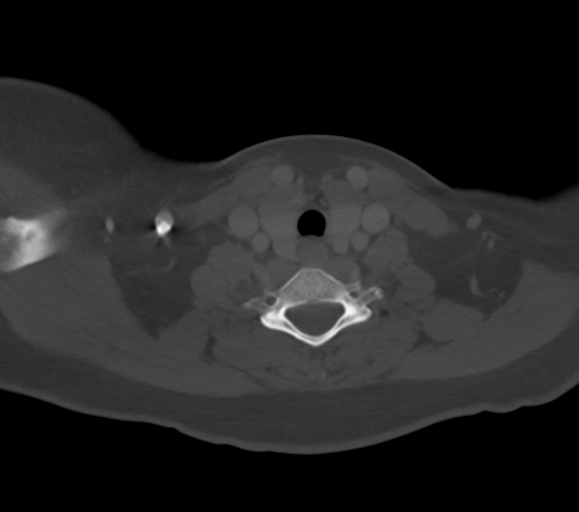
[im 95/159  bone]
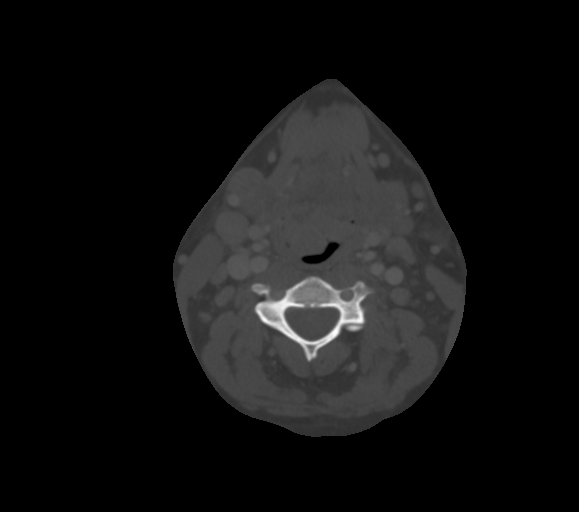
[im 127/159  bone]
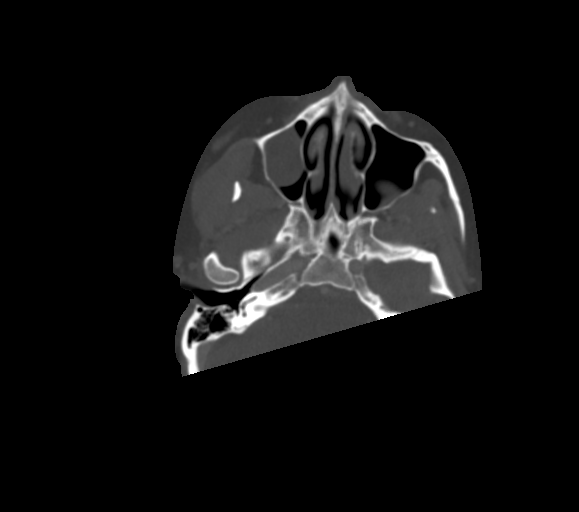

[12 of 35 positions shown; findings below may reference images not displayed]

RADIATION DOSE REDUCTION: This exam was performed according to the
departmental dose-optimization program which includes automated
exposure control, adjustment of the mA and/or kV according to
patient size and/or use of iterative reconstruction technique.

CONTRAST:  75mL OMNIPAQUE IOHEXOL 300 MG/ML  SOLN
FINDINGS: Pharynx and larynx: Bilateral tonsillitis pattern with more swelling
on the right than the left. No sign of tonsillar or peritonsillar
abscess. Lingual tonsillar tissue is also markedly enlarged. These
changes are presumed to be inflammatory. Follow-up to resolution is
suggested to exclude the unlikely possibility of mass lesion. No
other mucosal or submucosal finding.

Salivary glands: Parotid and submandibular glands are normal.

Thyroid: Normal

Lymph nodes: Mild bilateral reactive nodal prominence but without
evidence of dominant node or suppuration.

Vascular: No abnormal vascular finding.

Limited intracranial: Normal

Visualized orbits: Normal

Mastoids and visualized paranasal sinuses: Retention cysts of the
maxillary sinuses. No widespread mucosal thickening or layering
fluid.

Skeleton: Normal

Upper chest: Normal

Other: None
IMPRESSION: Tonsillar enlargement, including of the lingual tonsillar tissue,
right more than left. No evidence of tonsillar or peritonsillar
abscess or parapharyngeal space inflammatory disease. Findings are
felt likely to be inflammatory. Follow-up to resolution is suggested
to exclude the unlikely possibility of neoplastic enlargement.

Reactive bilateral cervical chain prominence without dominant node
or suppuration.

## 2023-03-26 IMAGING — DX DG CHEST 1V PORT
1 series · 1 of 1 positions shown · non-contrast
Comparison: None Available.

CLINICAL DATA: Clinical data

EXAM:
PORTABLE CHEST 1 VIEW

[chest ap]
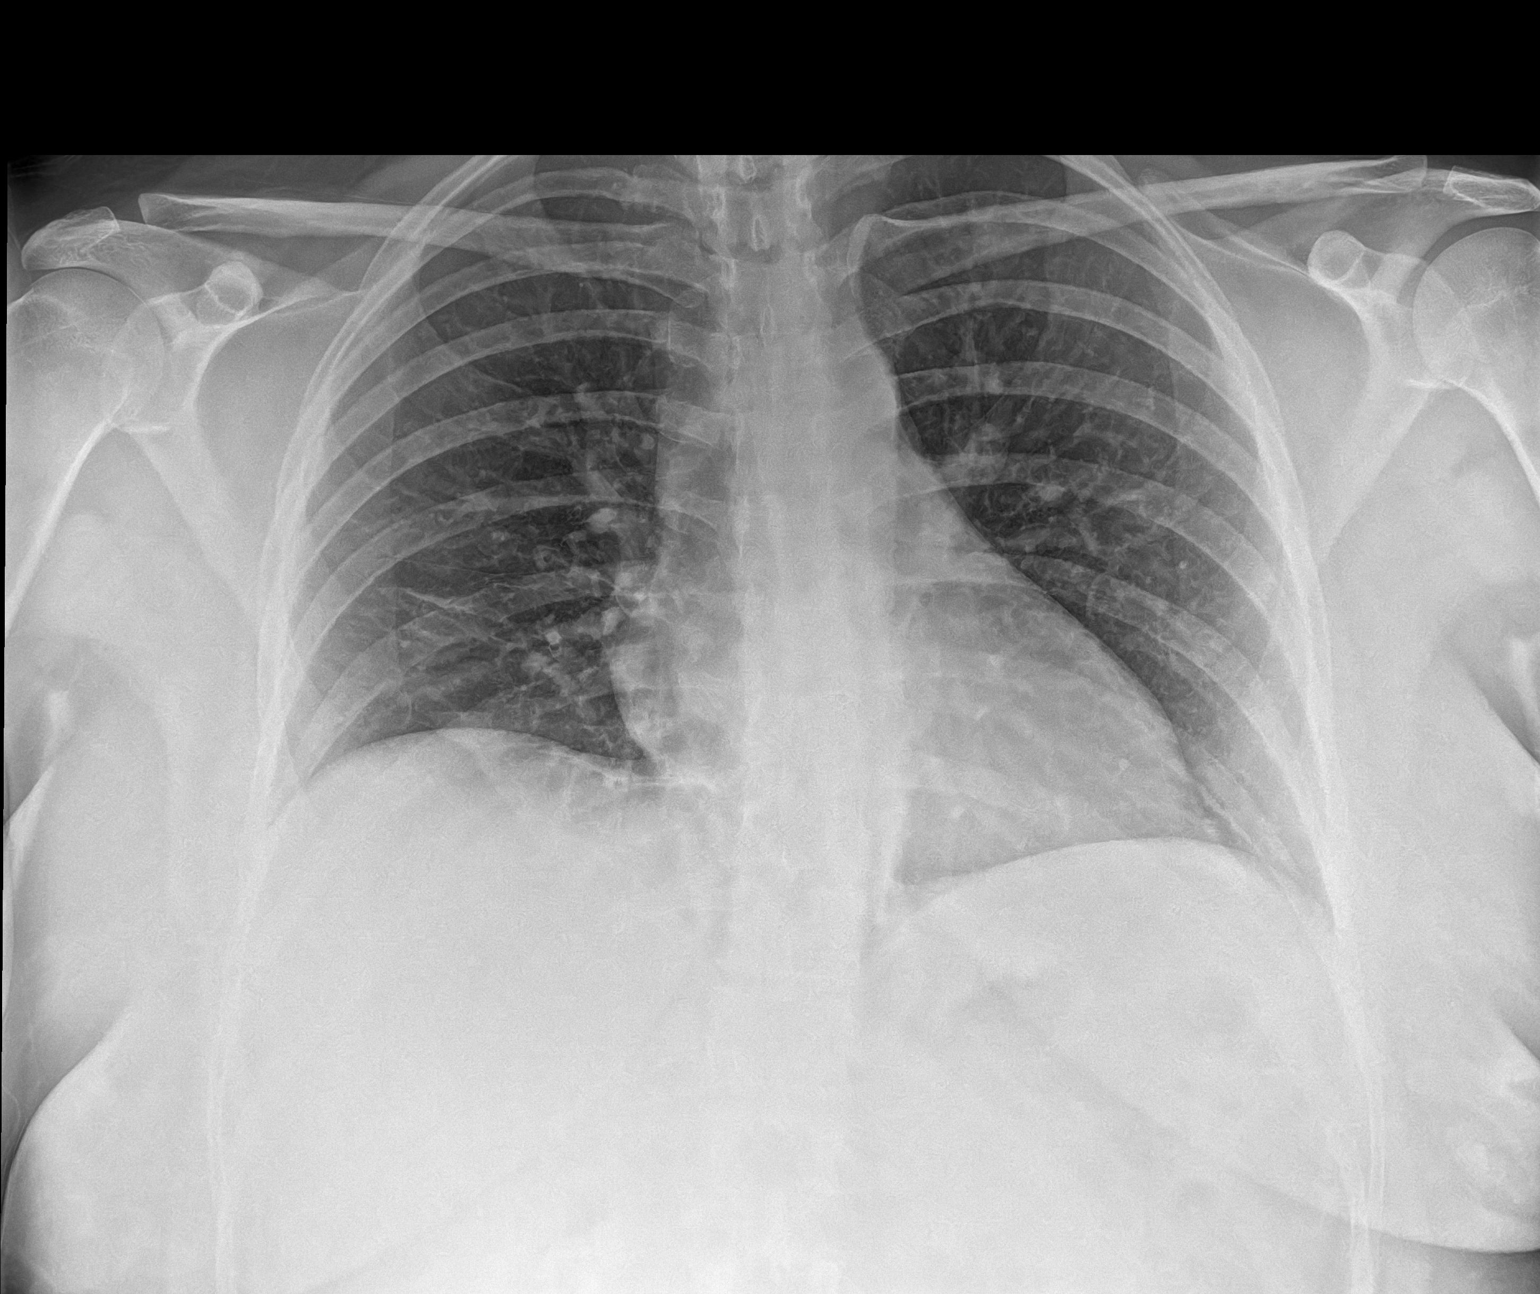

[1 of 1 positions shown; findings below may reference images not displayed]

FINDINGS: Normal mediastinum and cardiac silhouette. Normal pulmonary
vasculature. No evidence of effusion, infiltrate, or pneumothorax.
No acute bony abnormality.
IMPRESSION: No acute cardiopulmonary process.

## 2023-03-26 IMAGING — CT CT ABD-PELV W/ CM
2 of 4 series · 16 of 46 positions shown, 18 images · IV contrast (APPLIED)
Comparison: CT abdomen and pelvis 10/19/2015.

CLINICAL DATA: Acute abdominal pain.

EXAM:
CT ABDOMEN AND PELVIS WITH CONTRAST
TECHNIQUE: Multidetector CT imaging of the abdomen and pelvis was performed
using the standard protocol following bolus administration of
intravenous contrast.

[Series 2: abd pel w · axial · 0.74mm/px · z∈[-361,+44]mm · 13 of 89 slices shown, 15 images]
[im 4/89  soft-tissue]
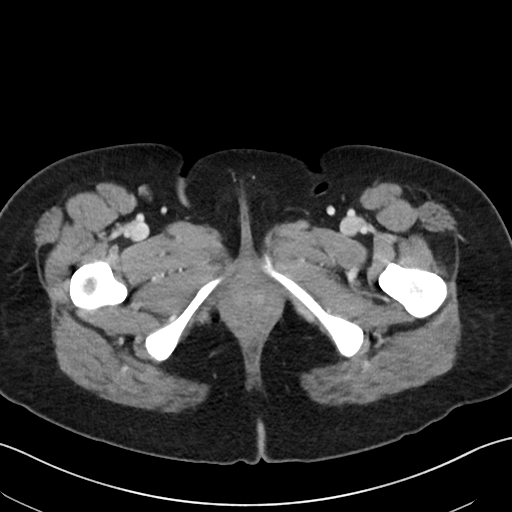
[im 4/89  bone]
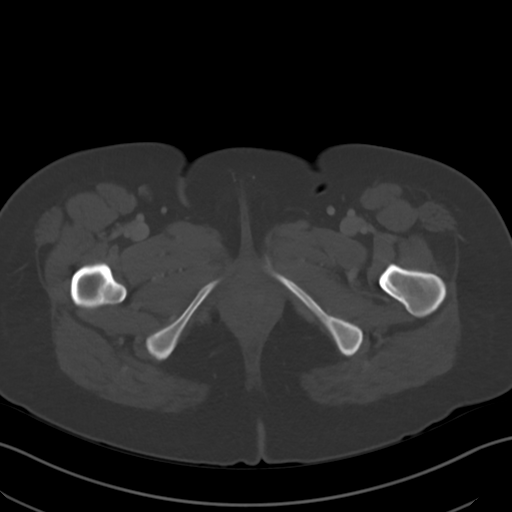
[im 12/89  soft-tissue]
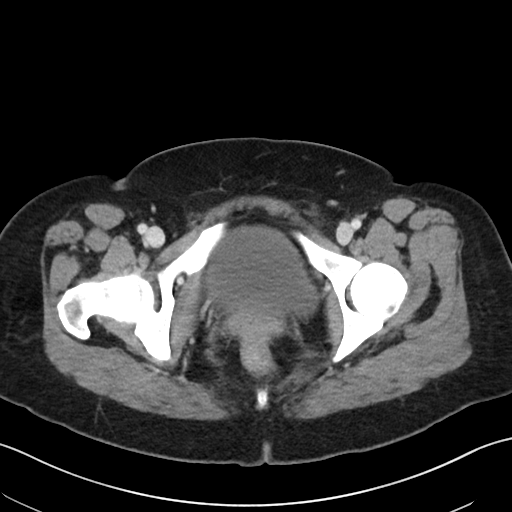
[im 19/89  soft-tissue]
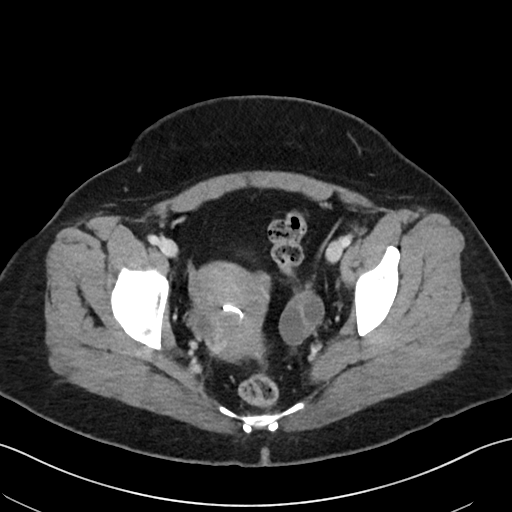
[im 26/89  soft-tissue]
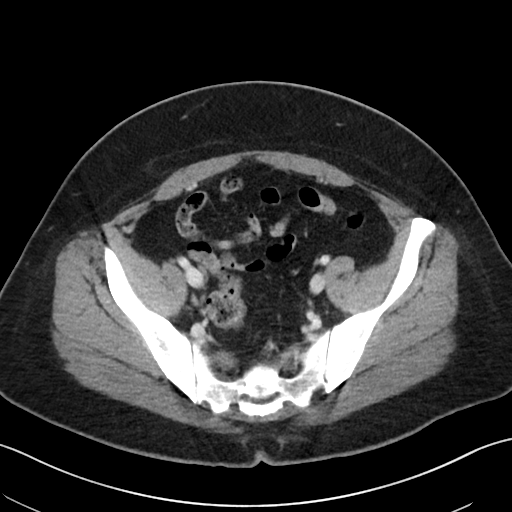
[im 30/89  soft-tissue]
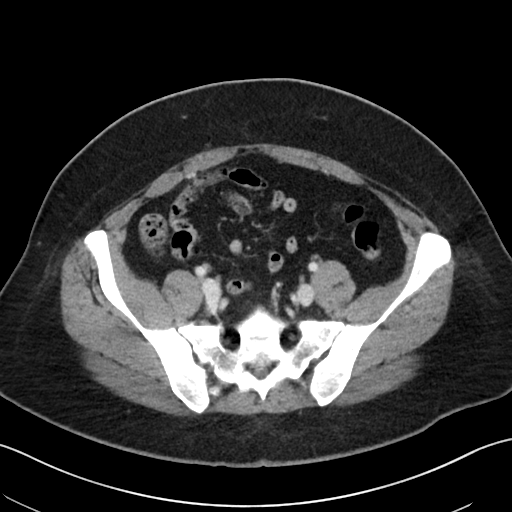
[im 37/89  soft-tissue]
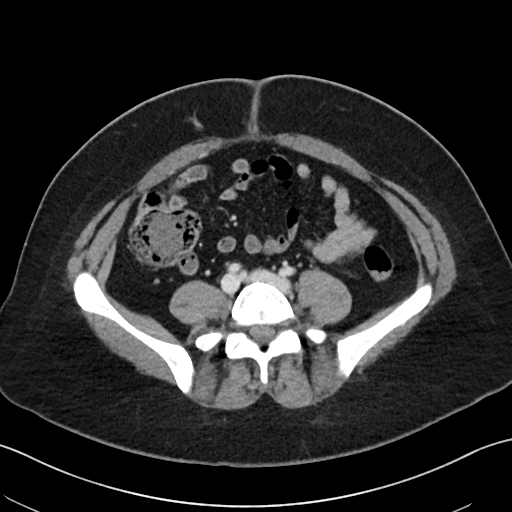
[im 45/89  soft-tissue]
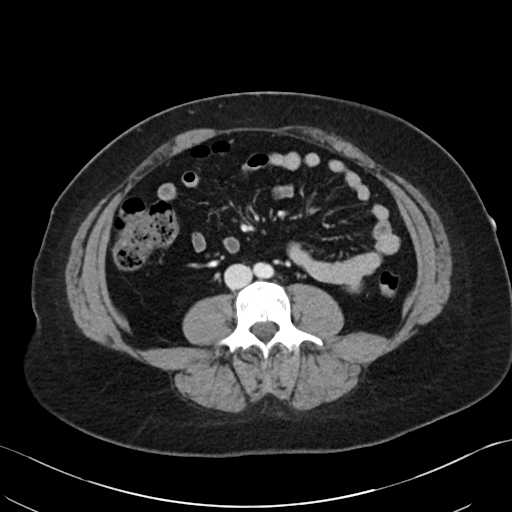
[im 52/89  soft-tissue]
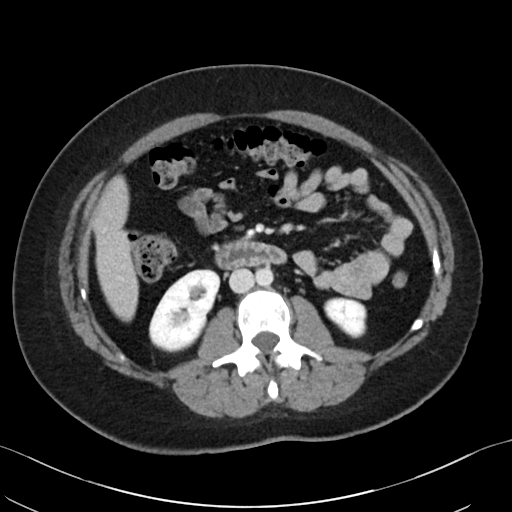
[im 59/89  soft-tissue]
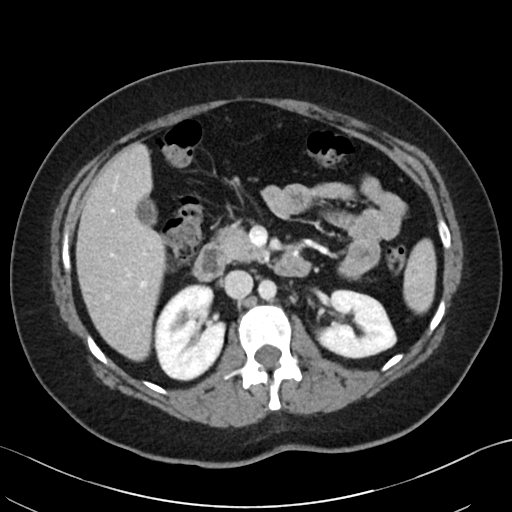
[im 59/89  bone]
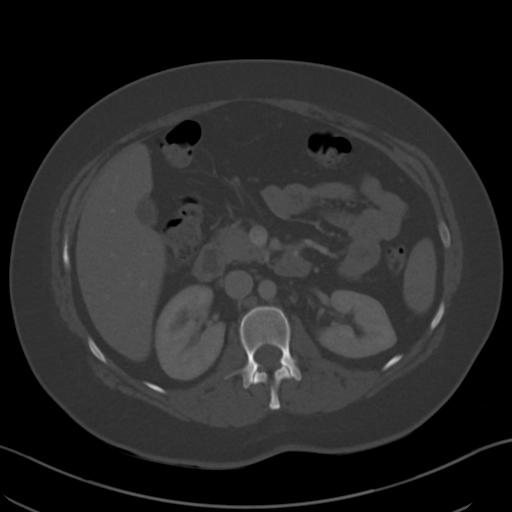
[im 63/89  soft-tissue]
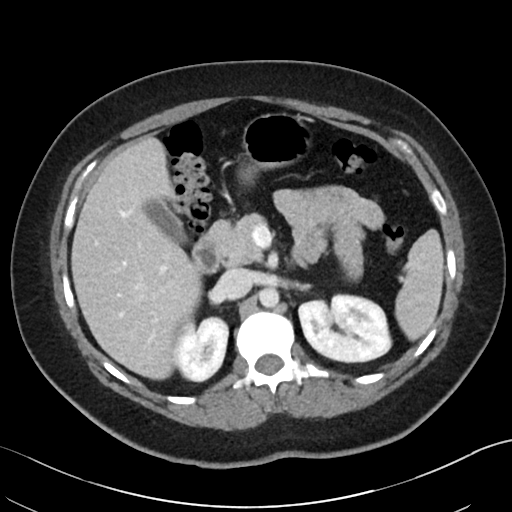
[im 70/89  soft-tissue]
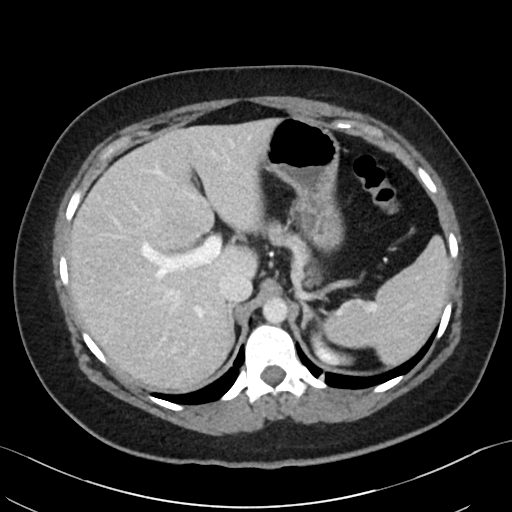
[im 78/89  soft-tissue]
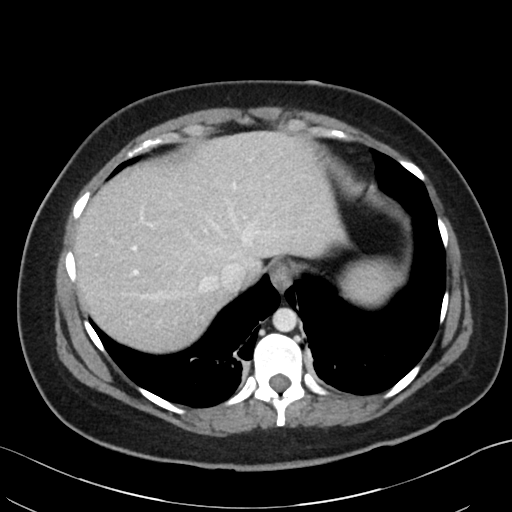
[im 85/89  soft-tissue]
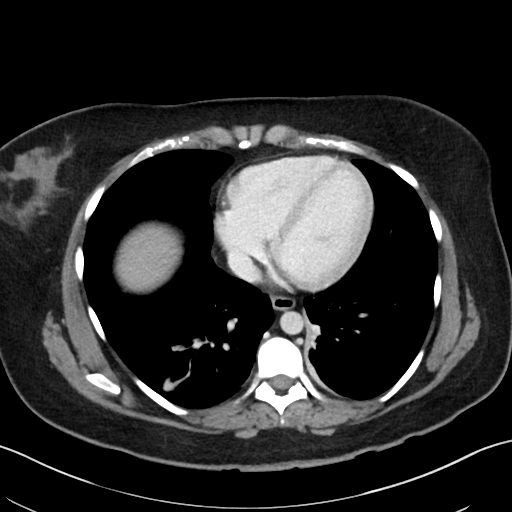

[Series 5: coronal · coronal · 0.70mm/px · 3 of 102 slices shown]
[im 34/102  soft-tissue]
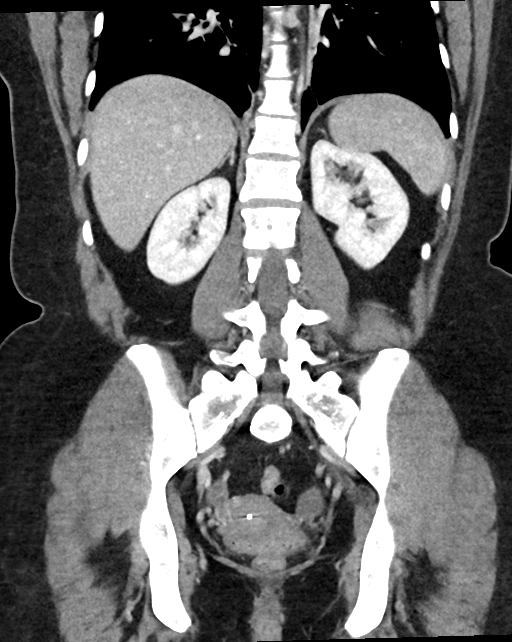
[im 45/102  soft-tissue]
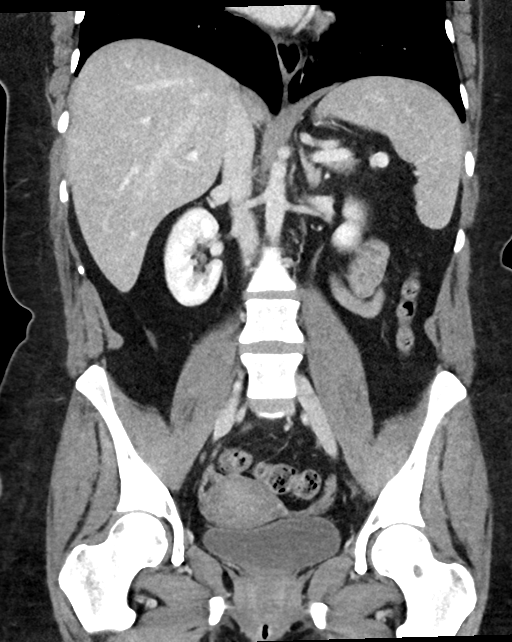
[im 57/102  soft-tissue]
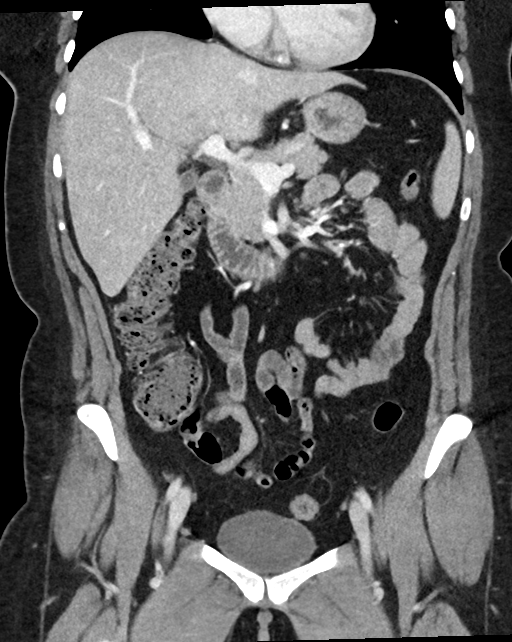

[16 of 46 positions shown; findings below may reference images not displayed]

RADIATION DOSE REDUCTION: This exam was performed according to the
departmental dose-optimization program which includes automated
exposure control, adjustment of the mA and/or kV according to
patient size and/or use of iterative reconstruction technique.

CONTRAST:  100mL OMNIPAQUE IOHEXOL 300 MG/ML  SOLN
FINDINGS: Lower chest: There is atelectasis in the lung bases.

Hepatobiliary: No focal liver abnormality is seen. No gallstones,
gallbladder wall thickening, or biliary dilatation.

Pancreas: Unremarkable. No pancreatic ductal dilatation or
surrounding inflammatory changes.

Spleen: Normal in size without focal abnormality.

Adrenals/Urinary Tract: Adrenal glands are unremarkable. Kidneys are
normal, without renal calculi, focal lesion, or hydronephrosis.
Bladder is unremarkable.

Stomach/Bowel: Stomach is within normal limits. Appendix appears
normal. No evidence of bowel wall thickening, distention, or
inflammatory changes.

Vascular/Lymphatic: No significant vascular findings are present. No
enlarged abdominal or pelvic lymph nodes.

Reproductive: IUD is in the uterus, possibly low-lying. Right ovary
unremarkable. Dominant follicles measuring up to 2 cm noted in the
left ovary.

Other: No abdominal wall hernia or abnormality. No abdominopelvic
ascites.

Musculoskeletal: No acute or significant osseous findings.
IMPRESSION: 1. No acute localizing process in the abdomen or pelvis.
2. IUD is present which may be low lying. This can be followed up
with pelvic ultrasound.

## 2023-03-29 ENCOUNTER — Emergency Department (HOSPITAL_BASED_OUTPATIENT_CLINIC_OR_DEPARTMENT_OTHER)
Admission: EM | Admit: 2023-03-29 | Discharge: 2023-03-29 | Disposition: A | Discharge status: Home / Self Care | Payer: Self-pay | Attending: Emergency Medicine | Admitting: Emergency Medicine

## 2023-03-29 ENCOUNTER — Other Ambulatory Visit: Payer: Self-pay

## 2023-03-29 ENCOUNTER — Emergency Department (HOSPITAL_BASED_OUTPATIENT_CLINIC_OR_DEPARTMENT_OTHER): Payer: Self-pay

## 2023-03-29 ENCOUNTER — Encounter (HOSPITAL_BASED_OUTPATIENT_CLINIC_OR_DEPARTMENT_OTHER): Payer: Self-pay

## 2023-03-29 DIAGNOSIS — Z91040 Latex allergy status: Secondary | ICD-10-CM | POA: Insufficient documentation

## 2023-03-29 DIAGNOSIS — N83202 Unspecified ovarian cyst, left side: Secondary | ICD-10-CM | POA: Insufficient documentation

## 2023-03-29 DIAGNOSIS — D219 Benign neoplasm of connective and other soft tissue, unspecified: Secondary | ICD-10-CM | POA: Insufficient documentation

## 2023-03-29 LAB — URINALYSIS, MICROSCOPIC (REFLEX)

## 2023-03-29 LAB — COMPREHENSIVE METABOLIC PANEL
ALT: 20 U/L (ref 0–44)
AST: 17 U/L (ref 15–41)
Albumin: 4 g/dL (ref 3.5–5.0)
Alkaline Phosphatase: 62 U/L (ref 38–126)
Anion gap: 11 (ref 5–15)
BUN: 14 mg/dL (ref 6–20)
CO2: 26 mmol/L (ref 22–32)
Calcium: 9.3 mg/dL (ref 8.9–10.3)
Chloride: 100 mmol/L (ref 98–111)
Creatinine, Ser: 1.07 mg/dL — ABNORMAL HIGH (ref 0.44–1.00)
GFR, Estimated: >60 mL/min (ref >60)
Glucose, Bld: 208 mg/dL — ABNORMAL HIGH (ref 70–99)
Potassium: 3.8 mmol/L (ref 3.5–5.1)
Sodium: 137 mmol/L (ref 135–145)
Total Bilirubin: 0.6 mg/dL (ref 0.3–1.2)
Total Protein: 7.4 g/dL (ref 6.5–8.1)

## 2023-03-29 LAB — CBC
HCT: 41 % (ref 36.0–46.0)
Hemoglobin: 13.5 g/dL (ref 12.0–15.0)
MCH: 25.8 pg — ABNORMAL LOW (ref 26.0–34.0)
MCHC: 32.9 g/dL (ref 30.0–36.0)
MCV: 78.2 fL — ABNORMAL LOW (ref 80.0–100.0)
Platelets: 313 10*3/uL (ref 150–400)
RBC: 5.24 MIL/uL — ABNORMAL HIGH (ref 3.87–5.11)
RDW: 13.5 % (ref 11.5–15.5)
WBC: 10.3 10*3/uL (ref 4.0–10.5)
nRBC: 0 % (ref 0.0–0.2)

## 2023-03-29 LAB — LIPASE, BLOOD: Lipase: 28 U/L (ref 11–51)

## 2023-03-29 LAB — PREGNANCY, URINE: Preg Test, Ur: NEGATIVE

## 2023-03-29 MED ORDER — SODIUM CHLORIDE 0.9 % IV BOLUS
1000.0000 mL | Freq: Once | INTRAVENOUS | Status: AC
  Administered 2023-03-29: 1000 mL via INTRAVENOUS

## 2023-03-29 MED ORDER — KETOROLAC TROMETHAMINE 30 MG/ML IJ SOLN
30.0000 mg | Freq: Once | INTRAMUSCULAR | Status: AC
  Administered 2023-03-29: 30 mg via INTRAVENOUS
  Filled 2023-03-29: qty 1

## 2023-03-29 MED ORDER — ONDANSETRON HCL 4 MG/2ML IJ SOLN
4.0000 mg | Freq: Once | INTRAMUSCULAR | Status: AC
  Administered 2023-03-29: 4 mg via INTRAVENOUS
  Filled 2023-03-29: qty 2

## 2023-03-29 MED ORDER — OXYCODONE-ACETAMINOPHEN 5-325 MG PO TABS
2.0000 | ORAL_TABLET | Freq: Once | ORAL | Status: AC
  Administered 2023-03-29: 2 via ORAL
  Filled 2023-03-29: qty 2

## 2023-03-29 MED ORDER — OXYCODONE-ACETAMINOPHEN 5-325 MG PO TABS: 1.0000 | ORAL_TABLET | Freq: Four times a day (QID) | ORAL | 0 refills | Status: AC | PRN

## 2023-03-29 MED ORDER — IBUPROFEN 800 MG PO TABS: 800.0000 mg | ORAL_TABLET | Freq: Three times a day (TID) | ORAL | 0 refills | Status: AC

## 2023-03-29 NOTE — ED Triage Notes (Signed)
She stated her lower ABD pain and feels like it is swelling. She stated the pain comes and goes for the last two days. She denied dysuria or vaginal discharge or foul smell.

## 2023-03-29 NOTE — ED Notes (Signed)
Patient transported to Ultrasound 

## 2023-03-29 NOTE — ED Provider Notes (Signed)
[facility] Provider Note   CSN: [id number] Arrival date & time: 03/29/23  1159     History  Chief Complaint  Patient presents with   Abdominal Pain    TAURI FANDRICH is a 32 y.o. female hx of IUD placement, here presenting with lower abdominal pain and swelling.  Patient states that she just had her menses came on several days ago.  She has some usual cramps but she noticed worsening abdominal distention.  Patient is also concerned that her IUD may be in the wrong position.  Patient states that she has a history of ovarian cyst but denies any history of ovarian torsion.  Denies any nausea or vomiting or diarrhea.  Patient is sexually active but denies any dysuria or vaginal discharge  The history is provided by the patient.       Home Medications Prior to Admission medications   Medication Sig Start Date End Date Taking? Authorizing Provider  albuterol (VENTOLIN HFA) 108 (90 Base) MCG/ACT inhaler INHALE 2 PUFFS EVERY SIX (6) HOURS AS NEEDED FOR WHEEZING.    [provider]  diphenoxylate-atropine (LOMOTIL) 2.5-0.025 MG tablet Take 1 tablet by mouth 4 (four) times daily as needed for diarrhea or loose stools. 07/25/22   Dulce Sellar, NP  levonorgestrel (MIRENA) 20 MCG/DAY IUD 1 each by Intrauterine route once. 5 years    [provider]  ondansetron (ZOFRAN-ODT) 4 MG disintegrating tablet Take 1 tablet (4 mg total) by mouth every 8 (eight) hours as needed for nausea or vomiting. 07/25/22   Dulce Sellar, NP      Allergies    Azithromycin, Bupropion, Buspar [buspirone], Cefprozil, and Latex    Review of Systems   Review of Systems  Gastrointestinal:  Positive for abdominal pain.  All other systems reviewed and are negative.   Physical Exam Updated Vital Signs BP (!) 137/93 (BP Location: Left Arm)   Pulse (!) 102   Temp 98.5 F (36.9 C) (Oral)   Resp 19   Ht 5\' 3"  (1.6 m)   Wt 83 kg   SpO2 98%    BMI 32.41 kg/m  Physical Exam Vitals and nursing note reviewed.  Constitutional:      Comments: Slightly uncomfortable  HENT:     Head: Normocephalic.     Mouth/Throat:     Mouth: Mucous membranes are moist.  Eyes:     Extraocular Movements: Extraocular movements intact.     Pupils: Pupils are equal, round, and reactive to light.  Cardiovascular:     Rate and Rhythm: Normal rate and regular rhythm.     Heart sounds: Normal heart sounds.  Pulmonary:     Effort: Pulmonary effort is normal.     Breath sounds: Normal breath sounds.  Abdominal:     Comments: Slightly distended and diffuse lower abdominal tenderness  Skin:    Capillary Refill: Capillary refill takes less than 2 seconds.  Neurological:     General: No focal deficit present.     Mental Status: She is alert and oriented to person, place, and time.  Psychiatric:        Mood and Affect: Mood normal.        Behavior: Behavior normal.     ED Results / Procedures / Treatments   Labs (all labs ordered are listed, but only abnormal results are displayed) Labs Reviewed  COMPREHENSIVE METABOLIC PANEL - Abnormal; Notable for the following components:      Result Value  Glucose, Bld 208 (*)    Creatinine, Ser 1.07 (*)    All other components within normal limits  CBC - Abnormal; Notable for the following components:   RBC 5.24 (*)    MCV 78.2 (*)    MCH 25.8 (*)    All other components within normal limits  URINALYSIS, ROUTINE W REFLEX MICROSCOPIC - Abnormal; Notable for the following components:   APPearance CLOUDY (*)    Hgb urine dipstick TRACE (*)    All other components within normal limits  URINALYSIS, MICROSCOPIC (REFLEX) - Abnormal; Notable for the following components:   Bacteria, UA FEW (*)    All other components within normal limits  LIPASE, BLOOD  PREGNANCY, URINE    EKG None  Radiology No results found.  Procedures Procedures    Medications Ordered in ED Medications  sodium chloride 0.9  % bolus 1,000 mL (has no administration in time range)  ketorolac (TORADOL) 30 MG/ML injection 30 mg (has no administration in time range)    ED Course/ Medical Decision Making/ A&P                                 Medical Decision Making AMORIA ESCHETE is a 31 y.o. female here presenting with lower abdominal pain and distention.  Patient has history of ovarian cyst and cornu uterus.  Plan to get ovarian ultrasound and labs and urinalysis.  4:41 PM I have reviewed patient's labs and independently interpreted imaging studies.  CBC CMP and UA unremarkable.  Ultrasound showed 4 cm cyst with no torsion.  Patient also has fibroid which is likely contributing to her pain.  Patient states that she has no insurance right now.  Will refer to women's health.  Patient request some pain medicine since she has been taking ibuprofen.  Problems Addressed: Cyst of left ovary: acute illness or injury Fibroids: acute illness or injury  Amount and/or Complexity of Data Reviewed Labs: ordered. Decision-making details documented in ED Course. Radiology: ordered and independent interpretation performed. Decision-making details documented in ED Course.  Risk Prescription drug management.   Final Clinical Impression(s) / ED Diagnoses Final diagnoses:  None    Rx / DC Orders ED Discharge Orders     None         Charlynne Pander, MD 03/29/23 1642

## 2023-03-29 NOTE — Discharge Instructions (Addendum)
As we discussed, your pain is likely from fibroids and ovarian cyst.  Please take around-the-clock Motrin for pain.  You can take Percocet for severe pain  Please call women's health for follow-up to discuss options  Return to ER if you have worse abdominal pain or vomiting or fever

## 2023-04-01 LAB — URINALYSIS, ROUTINE W REFLEX MICROSCOPIC
Bilirubin Urine: NEGATIVE
Glucose, UA: NEGATIVE mg/dL
Ketones, ur: NEGATIVE mg/dL
Nitrite: NEGATIVE
Protein, ur: NEGATIVE mg/dL
Specific Gravity, Urine: >=1.03 (ref 1.005–1.030)
pH: 5.5 (ref 5.0–8.0)
